# Patient Record
Sex: Female | Born: 1945 | Race: White | Hispanic: No | Marital: Married | Smoking: Never smoker
Health system: Southern US, Community
[De-identification: ages and names within clinical notes are randomized; demographics above are authoritative.]

## PROBLEM LIST (undated history)

## (undated) DIAGNOSIS — R233 Spontaneous ecchymoses: Secondary | ICD-10-CM

## (undated) DIAGNOSIS — R42 Dizziness and giddiness: Secondary | ICD-10-CM

## (undated) DIAGNOSIS — R51 Headache: Secondary | ICD-10-CM

## (undated) DIAGNOSIS — R238 Other skin changes: Secondary | ICD-10-CM

## (undated) HISTORY — PX: CHOLECYSTECTOMY: SHX55

## (undated) HISTORY — PX: OTHER SURGICAL HISTORY: SHX169

## (undated) HISTORY — PX: NECK SURGERY: SHX720

## (undated) HISTORY — PX: KNEE ARTHROSCOPY: SHX127

## (undated) HISTORY — PX: DILATION AND CURETTAGE OF UTERUS: SHX78

## (undated) HISTORY — PX: ASPIRATION BIOPSY: SHX1197

---

## 1998-10-29 ENCOUNTER — Ambulatory Visit (HOSPITAL_COMMUNITY): Admission: RE | Admit: 1998-10-29 | Discharge: 1998-10-29 | Payer: Self-pay | Admitting: Gastroenterology

## 1998-10-29 ENCOUNTER — Encounter: Payer: Self-pay | Admitting: Gastroenterology

## 1999-02-09 ENCOUNTER — Ambulatory Visit (HOSPITAL_COMMUNITY): Admission: RE | Admit: 1999-02-09 | Discharge: 1999-02-10 | Payer: Self-pay | Admitting: Surgery

## 1999-02-09 ENCOUNTER — Encounter: Payer: Self-pay | Admitting: Surgery

## 1999-05-07 ENCOUNTER — Other Ambulatory Visit: Admission: RE | Admit: 1999-05-07 | Discharge: 1999-05-07 | Payer: Self-pay | Admitting: Family Medicine

## 2004-03-06 ENCOUNTER — Emergency Department (HOSPITAL_COMMUNITY): Admission: EM | Admit: 2004-03-06 | Discharge: 2004-03-06 | Payer: Self-pay | Admitting: *Deleted

## 2013-01-05 ENCOUNTER — Other Ambulatory Visit: Payer: Self-pay | Admitting: Physical Medicine and Rehabilitation

## 2013-01-05 DIAGNOSIS — D179 Benign lipomatous neoplasm, unspecified: Secondary | ICD-10-CM

## 2013-01-10 ENCOUNTER — Ambulatory Visit
Admission: RE | Admit: 2013-01-10 | Discharge: 2013-01-10 | Disposition: A | Discharge status: Home / Self Care | Payer: Medicare Other | Source: Ambulatory Visit | Attending: Physical Medicine and Rehabilitation | Admitting: Physical Medicine and Rehabilitation

## 2013-01-10 ENCOUNTER — Other Ambulatory Visit: Payer: Self-pay | Admitting: Physical Medicine and Rehabilitation

## 2013-01-10 DIAGNOSIS — D179 Benign lipomatous neoplasm, unspecified: Secondary | ICD-10-CM

## 2013-01-10 MED ORDER — IOHEXOL 300 MG/ML  SOLN
100.0000 mL | Freq: Once | INTRAMUSCULAR | Status: AC | PRN
  Administered 2013-01-10: 100 mL via INTRAVENOUS

## 2013-02-06 ENCOUNTER — Encounter (INDEPENDENT_AMBULATORY_CARE_PROVIDER_SITE_OTHER): Payer: Self-pay | Admitting: Surgery

## 2013-02-13 ENCOUNTER — Ambulatory Visit (INDEPENDENT_AMBULATORY_CARE_PROVIDER_SITE_OTHER): Payer: BC Managed Care – PPO | Admitting: Surgery

## 2013-02-13 ENCOUNTER — Encounter (INDEPENDENT_AMBULATORY_CARE_PROVIDER_SITE_OTHER): Payer: Self-pay | Admitting: Surgery

## 2013-02-13 VITALS — BP 112/76 | HR 68 | Temp 97.4°F | Resp 16 | Ht 60.0 in | Wt 156.0 lb

## 2013-02-13 NOTE — Progress Notes (Signed)
Patient ID: Ariel Stokes, female   DOB: [birth year], 67 y.o.   MRN: [id number]  Chief Complaint  Patient presents with  . Lipoma    HPI Ariel Stokes is a 67 y.o. female.  Referred by Dr. Sheran Luz for evaluation of large retroperitoneal lipoma HPI This is a 67 year old female in good health who has chronic back pain. She recently had a MRI of her spine incidentally noted a large structure between these so as and the iliac is muscle which was consistent with a lipoma. 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 There are not any complex features to suggest liposarcoma. The patient has chronic back pain. She states that sometimes the pain is worse on her right and sometimes hurts back into her right gluteal region heading down her posterior thigh. She does have some intermittent constipation. She presents today for surgical evaluation.  History reviewed. No pertinent past medical history.  Past Surgical History  Procedure Laterality Date  . Neck surgery    . Spinal fusion neck    . Cholecystectomy    . Knee arthroscopy Left   . Dilation and curettage of uterus      History reviewed. No pertinent family history.  Social History History  Substance Use Topics  . Smoking status: Never Smoker   . Smokeless tobacco: Never Used  . Alcohol Use: No     Comment: only occasionally per week    No Known Allergies  Current Outpatient Prescriptions  Medication Sig Dispense Refill  . ibuprofen (ADVIL,MOTRIN) 200 MG tablet Take 200 mg by mouth every 6 (six) hours as needed for pain.      . Ox Bile Extract POWD by Does not apply route.       No current facility-administered medications for this visit.    Review of Systems Review of Systems  Constitutional: Negative for fever, chills and unexpected weight change.  HENT: Negative for hearing loss, congestion, sore  throat, trouble swallowing and voice change.   Eyes: Negative for visual disturbance.  Respiratory: Negative for cough and wheezing.   Cardiovascular: Negative for chest pain, palpitations and leg swelling.  Gastrointestinal: Positive for constipation and abdominal distention. Negative for nausea, vomiting, abdominal pain, diarrhea, blood in stool and anal bleeding.  Genitourinary: Negative for hematuria, vaginal bleeding and difficulty urinating.  Musculoskeletal: Positive for back pain. Negative for arthralgias.  Skin: Negative for rash and wound.  Neurological: Negative for seizures, syncope and headaches.  Hematological: Negative for adenopathy. Does not bruise/bleed easily.  Psychiatric/Behavioral: Negative for confusion.    Blood pressure 112/76, pulse 68, temperature 97.4 F (36.3 C), temperature source Temporal, resp. rate 16, height 5' (1.524 m), weight 156 lb (70.761 kg).  Physical Exam Physical Exam WDWN in NAD HEENT:  EOMI, sclera anicteric Neck:  No masses, no thyromegaly Lungs:  CTA bilaterally; normal respiratory effort CV:  Regular rate and rhythm; no murmurs Abd:  +bowel sounds, soft, non-tender, slight fullness RLQ; no palpable masses Ext:  Well-perfused; no edema Skin:  Warm, dry; no sign of jaundice  Data Reviewed Clinical Data: Occasional abdominal pain. History of  cholecystectomy. Outside lumbar MRI 12/23/2012 questioned a lipoma  between the right psoas and iliacus muscles.  CT ABDOMEN AND PELVIS WITH CONTRAST  Technique: Multidetector CT imaging of the abdomen and pelvis was  performed following the standard protocol during bolus  administration of intravenous contrast.  Contrast:  OMNIPAQUE IOHEXOL 300 MG/ML SOLN  Comparison: Report from outside MRI.  Findings: The lung bases are clear. There is no pleural effusion.  There is no significant biliary dilatation status post  cholecystectomy. The liver, spleen, pancreas and adrenal glands  appear  normal. There are left greater than right renal parapelvic  cysts. There is no hydronephrosis or suspicious renal finding.  The stomach, small bowel and colon demonstrate no abnormalities.  The uterus is atrophied. There is no adnexal mass. The bladder  appears normal.  There is asymmetric fat extending from the right pararenal space  inferiorly along the anterior aspect of the right iliacus muscle.  Within the right groin, this extends between the iliopsoas tendon  and the femoral vessels. The cephalocaudad extent is approximately  22 cm. There is no solid component associated with this. The  right colon is mildly displaced medially. There is no osseous  erosion.  Mild degenerative changes are present throughout the spine. There  is a partially calcified left paracentral disc protrusion at T12-  L1.  IMPRESSION:  1. There is a large simple lipoma within the right retroperitoneum  extending into the groin along the iliopsoas tendon. This does not  demonstrate any complex features to suggest a liposarcoma.  2. No acute abdominal pelvic findings.  3. Bilateral renal parapelvic cysts.  Original Report Authenticated By: Carey Bullocks, M.D.   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  Minimally symptomatic     Plan    Recommend core biopsy to rule out liposarcoma.  I explained to the patient and her husband that excision of lipomas needs to be a complete excision or the risk of recurrence is high. A complete excision of this very large lipoma extending from the retroperitoneum down through the femoral canal into the mid thigh would be rather extensive and may even require 2 separate incisions. She is minimally symptomatic from the mass at this time. We will obtain a core biopsy and then we'll see the patient back to discuss the findings and to determine whether to proceed with surgery.  Wilmon Arms. [name], MD, [facility]  02/13/2013 3:23  PM         Ariel Stokes K. 02/13/2013, 3:14 PM

## 2013-02-14 ENCOUNTER — Encounter (HOSPITAL_COMMUNITY): Payer: Self-pay

## 2013-02-14 ENCOUNTER — Other Ambulatory Visit: Payer: Self-pay | Admitting: Radiology

## 2013-02-15 ENCOUNTER — Encounter (HOSPITAL_COMMUNITY): Payer: Self-pay

## 2013-02-15 ENCOUNTER — Ambulatory Visit (HOSPITAL_COMMUNITY)
Admission: RE | Admit: 2013-02-15 | Discharge: 2013-02-15 | Disposition: A | Discharge status: Home / Self Care | Payer: BC Managed Care – PPO | Source: Ambulatory Visit | Attending: Surgery | Admitting: Surgery

## 2013-02-15 DIAGNOSIS — D175 Benign lipomatous neoplasm of intra-abdominal organs: Secondary | ICD-10-CM | POA: Insufficient documentation

## 2013-02-15 LAB — CBC
MCHC: 33.9 g/dL (ref 30.0–36.0)
Platelets: 143 10*3/uL — ABNORMAL LOW (ref 150–400)
RDW: 13.5 % (ref 11.5–15.5)
WBC: 4.4 10*3/uL (ref 4.0–10.5)

## 2013-02-15 LAB — PROTIME-INR
INR: 1.02 (ref 0.00–1.49)
Prothrombin Time: 13.3 seconds (ref 11.6–15.2)

## 2013-02-15 LAB — APTT: aPTT: 31 seconds (ref 24–37)

## 2013-02-15 MED ORDER — MIDAZOLAM HCL 2 MG/2ML IJ SOLN
INTRAMUSCULAR | Status: AC | PRN
  Administered 2013-02-15: 1 mg via INTRAVENOUS

## 2013-02-15 MED ORDER — FENTANYL CITRATE 0.05 MG/ML IJ SOLN
INTRAMUSCULAR | Status: AC | PRN
  Administered 2013-02-15: 50 ug via INTRAVENOUS

## 2013-02-15 MED ORDER — FENTANYL CITRATE 0.05 MG/ML IJ SOLN
INTRAMUSCULAR | Status: AC
  Filled 2013-02-15: qty 4

## 2013-02-15 MED ORDER — MIDAZOLAM HCL 2 MG/2ML IJ SOLN
INTRAMUSCULAR | Status: AC
  Filled 2013-02-15: qty 4

## 2013-02-15 MED ORDER — SODIUM CHLORIDE 0.9 % IV SOLN
Freq: Once | INTRAVENOUS | Status: AC
  Administered 2013-02-15: 1000 mL via INTRAVENOUS

## 2013-02-15 NOTE — H&P (Signed)
Agree 

## 2013-02-15 NOTE — Procedures (Signed)
Procedure:  CT guided core biopsy of retroperitoneal mass Findings:  Right pelvic RP fatty tumor sampled with 18 G cores x 7 via 17 G needle. No complications.

## 2013-02-15 NOTE — H&P (Signed)
Ariel Stokes is an 67 y.o. female.   Chief Complaint: History of back pain; worse x 2 months MRI and CT reveal large Rt retroperitoneal mass Scheduled now for biopsy HPI: back pain  History reviewed. No pertinent past medical history.  Past Surgical History  Procedure Laterality Date  . Neck surgery    . Spinal fusion neck    . Cholecystectomy    . Knee arthroscopy Left   . Dilation and curettage of uterus      History reviewed. No pertinent family history. Social History:  reports that she has never smoked. She has never used smokeless tobacco. She reports that she does not drink alcohol or use illicit drugs.  Allergies:  Allergies  Allergen Reactions  . Other     Band-Aids- cause rash     (Not in a hospital admission)  No results found for this or any previous visit (from the past 48 hour(s)). No results found.  Review of Systems  Constitutional: Negative for fever.  Respiratory: Negative for cough.   Cardiovascular: Negative for chest pain.  Gastrointestinal: Negative for nausea and vomiting.  Musculoskeletal: Positive for back pain.  Neurological: Negative for weakness and headaches.    Blood pressure 149/87, pulse 70, temperature 96.9 F (36.1 C), temperature source Oral, resp. rate 18, height 5' 0.5" (1.537 m), weight 154 lb (69.854 kg), SpO2 97.00%. Physical Exam  Constitutional: She is oriented to person, place, and time. She appears well-developed and well-nourished.  Cardiovascular: Normal rate, regular rhythm and normal heart sounds.   No murmur heard. Respiratory: Effort normal and breath sounds normal. She has no wheezes.  GI: Soft. Bowel sounds are normal. There is tenderness.  Musculoskeletal: Normal range of motion.  Neurological: She is alert and oriented to person, place, and time.  Skin: Skin is warm and dry.  Psychiatric: She has a normal mood and affect. Her behavior is normal. Judgment and thought content normal.      Assessment/Plan Back pain x 2 months MRI and CT show retroperitoneal mass Scheduled now for biopsy of same Pt aware of procedure benefits and risks and agreeable to proceed Consent signed and in chart  TURPIN,PAMELA A 02/15/2013, 8:00 AM

## 2013-02-26 ENCOUNTER — Ambulatory Visit (INDEPENDENT_AMBULATORY_CARE_PROVIDER_SITE_OTHER): Payer: BC Managed Care – PPO | Admitting: Surgery

## 2013-02-26 ENCOUNTER — Encounter (INDEPENDENT_AMBULATORY_CARE_PROVIDER_SITE_OTHER): Payer: Self-pay | Admitting: Surgery

## 2013-02-26 VITALS — BP 122/64 | HR 68 | Temp 97.2°F | Resp 16 | Ht 60.5 in | Wt 156.0 lb

## 2013-02-26 DIAGNOSIS — D175 Benign lipomatous neoplasm of intra-abdominal organs: Secondary | ICD-10-CM

## 2013-02-26 NOTE — Progress Notes (Addendum)
Chief Complaint   Patient presents with   .  Lipoma   HPI  Ariel Stokes is a 67 y.o. female. Referred by Dr. 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 She recently had a MRI of her spine incidentally noted a large structure between these so as and the iliac is muscle which was consistent with a lipoma. 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 There are not any complex features to suggest liposarcoma. The patient has chronic back pain. She states that sometimes the pain is worse on her right and sometimes hurts back into her right gluteal region heading down her posterior thigh. She does have some intermittent constipation.  CT-guided biopsy of the mass shows mature adipose tissue with no malignancy or atypia.  History reviewed. No pertinent past medical history.  Past Surgical History   Procedure  Laterality  Date   .  Neck surgery     .  Spinal fusion neck     .  Cholecystectomy     .  Knee arthroscopy  Left    .  Dilation and curettage of uterus     History reviewed. No pertinent family history.  Social History  History   Substance Use Topics   .  Smoking status:  Never Smoker   .  Smokeless tobacco:  Never Used   .  Alcohol Use:  No      Comment: only occasionally per week   No Known Allergies  Current Outpatient Prescriptions   Medication  Sig  Dispense  Refill   .  ibuprofen (ADVIL,MOTRIN) 200 MG tablet  Take 200 mg by mouth every 6 (six) hours as needed for pain.     .  Ox Bile Extract POWD  by Does not apply route.      No current facility-administered medications for this visit.   Review of Systems  Review of Systems  Constitutional: Negative for fever, chills and unexpected weight change.  HENT: Negative for hearing loss, congestion, sore throat, trouble  swallowing and voice change.  Eyes: Negative for visual disturbance.  Respiratory: Negative for cough and wheezing.  Cardiovascular: Negative for chest pain, palpitations and leg swelling.  Gastrointestinal: Positive for constipation and abdominal distention. Negative for nausea, vomiting, abdominal pain, diarrhea, blood in stool and anal bleeding.  Genitourinary: Negative for hematuria, vaginal bleeding and difficulty urinating.  Musculoskeletal: Positive for back pain. Negative for arthralgias.  Skin: Negative for rash and wound.  Neurological: Negative for seizures, syncope and headaches.  Hematological: Negative for adenopathy. Does not bruise/bleed easily.  Psychiatric/Behavioral: Negative for confusion.  Blood pressure 112/76, pulse 68, temperature 97.4 F (36.3 C), temperature source Temporal, resp. rate 16, height 5' (1.524 m), weight 156 lb (70.761 kg).  Physical Exam  Physical Exam  WDWN in NAD  HEENT: EOMI, sclera anicteric  Neck: No masses, no thyromegaly  Lungs: CTA bilaterally; normal respiratory effort  CV: Regular rate and rhythm; no murmurs  Abd: +bowel sounds, soft, non-tender, slight fullness RLQ; no palpable masses  Ext: Well-perfused; no edema  Skin: Warm, dry; no sign of jaundice  Data Reviewed  Clinical Data: Occasional abdominal pain. History of  cholecystectomy. Outside lumbar MRI 12/23/2012 questioned a lipoma  between the right psoas and iliacus muscles.  CT ABDOMEN AND PELVIS WITH CONTRAST  Technique:  Multidetector CT imaging of the abdomen and pelvis was  performed following the standard protocol during bolus  administration of intravenous contrast.  Contrast: OMNIPAQUE IOHEXOL 300 MG/ML SOLN  Comparison: Report from outside MRI.  Findings: The lung bases are clear. There is no pleural effusion.  There is no significant biliary dilatation status post  cholecystectomy. The liver, spleen, pancreas and adrenal glands  appear normal. There are left  greater than right renal parapelvic  cysts. There is no hydronephrosis or suspicious renal finding.  The stomach, small bowel and colon demonstrate no abnormalities.  The uterus is atrophied. There is no adnexal mass. The bladder  appears normal.  There is asymmetric fat extending from the right pararenal space  inferiorly along the anterior aspect of the right iliacus muscle.  Within the right groin, this extends between the iliopsoas tendon  and the femoral vessels. The cephalocaudad extent is approximately  22 cm. There is no solid component associated with this. The  right colon is mildly displaced medially. There is no osseous  erosion.  Mild degenerative changes are present throughout the spine. There  is a partially calcified left paracentral disc protrusion at T12-  L1.  IMPRESSION:  1. There is a large simple lipoma within the right retroperitoneum  extending into the groin along the iliopsoas tendon. This does not  demonstrate any complex features to suggest a liposarcoma.  2. No acute abdominal pelvic findings.  3. Bilateral renal parapelvic cysts.  Original Report Authenticated By: Carey Bullocks, M.D.  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  Patient has some symptoms of constipation, which may be attributed to the displacement of her right colon by the mass.  She also has chronic right buttock and upper leg pain which could be related to the segment of the lipoma running down to the femoral canal. Plan  Due to the patient's symptoms that may be a result of the very large mass, we will consider excision of the large retroperitoneal lipoma. I would like to discuss this case with a vascular surgeon as I may need their assistance with the femoral canal dissection. We will also explore the possibility of mobilizing this mass using laparoscopic techniques. After have discussed this with the other surgeons, I will contact the patient to schedule surgery.   The surgical procedure has been discussed with the patient.  Potential risks, benefits, alternative treatments, and expected outcomes have been explained.  All of the patient's questions at this time have been answered.  The likelihood of reaching the patient's treatment goal is good.  The patient understand the proposed surgical procedure and wishes to proceed.   Wilmon Arms. [name], MD, [facility]  02/26/2013 4:32 PM    She would like to wait until early June to plan her surgery.  Wilmon Arms. [name], MD, [facility]  02/26/2013 4:40 PM

## 2013-02-28 ENCOUNTER — Other Ambulatory Visit (INDEPENDENT_AMBULATORY_CARE_PROVIDER_SITE_OTHER): Payer: Self-pay | Admitting: Surgery

## 2013-02-28 NOTE — Progress Notes (Signed)
I have reviewed the patient's case with vascular surgery. They have agreed to be available if needed. I called the patient to discuss the upcoming surgery. We will plan a laparoscopic approach to try mobilize as much of the retroperitoneal lipomas possible. If this extends down into the femoral canal and does not mobilized easily then the vascular surgeons will come assist in opening the femoral canal and help to mobilize the mass.  The surgical procedure has been discussed with the patient.  Potential risks, benefits, alternative treatments, and expected outcomes have been explained.  All of the patient's questions at this time have been answered.  The likelihood of reaching the patient's treatment goal is good.  The patient understand the proposed surgical procedure and wishes to proceed. We will schedule this in June, per the patient's request  Ariel Stokes. [name], MD, [facility]  02/28/2013 4:46 PM

## 2013-03-05 ENCOUNTER — Encounter (HOSPITAL_COMMUNITY): Payer: Self-pay | Admitting: *Deleted

## 2013-03-05 ENCOUNTER — Telehealth (INDEPENDENT_AMBULATORY_CARE_PROVIDER_SITE_OTHER): Payer: Self-pay | Admitting: *Deleted

## 2013-03-05 ENCOUNTER — Emergency Department (HOSPITAL_COMMUNITY)
Admission: EM | Admit: 2013-03-05 | Discharge: 2013-03-05 | Disposition: A | Discharge status: Home / Self Care | Payer: BC Managed Care – PPO | Attending: Emergency Medicine | Admitting: Emergency Medicine

## 2013-03-05 DIAGNOSIS — M79604 Pain in right leg: Secondary | ICD-10-CM

## 2013-03-05 DIAGNOSIS — Z8589 Personal history of malignant neoplasm of other organs and systems: Secondary | ICD-10-CM | POA: Insufficient documentation

## 2013-03-05 DIAGNOSIS — M79609 Pain in unspecified limb: Secondary | ICD-10-CM | POA: Insufficient documentation

## 2013-03-05 DIAGNOSIS — M25579 Pain in unspecified ankle and joints of unspecified foot: Secondary | ICD-10-CM | POA: Insufficient documentation

## 2013-03-05 DIAGNOSIS — Z9889 Other specified postprocedural states: Secondary | ICD-10-CM | POA: Insufficient documentation

## 2013-03-05 DIAGNOSIS — M79671 Pain in right foot: Secondary | ICD-10-CM

## 2013-03-05 DIAGNOSIS — Z79899 Other long term (current) drug therapy: Secondary | ICD-10-CM | POA: Insufficient documentation

## 2013-03-05 MED ORDER — OXYCODONE-ACETAMINOPHEN 5-325 MG PO TABS: 1.0000 | ORAL_TABLET | ORAL | Status: DC | PRN

## 2013-03-05 MED ORDER — IBUPROFEN 200 MG PO TABS
600.0000 mg | ORAL_TABLET | Freq: Once | ORAL | Status: AC
  Administered 2013-03-05: 600 mg via ORAL
  Filled 2013-03-05: qty 3

## 2013-03-05 MED ORDER — OXYCODONE-ACETAMINOPHEN 5-325 MG PO TABS
1.0000 | ORAL_TABLET | Freq: Once | ORAL | Status: AC
  Administered 2013-03-05: 1 via ORAL
  Filled 2013-03-05: qty 1

## 2013-03-05 NOTE — Telephone Encounter (Signed)
Husband came to office to report patient has new symptoms.  Husband reports beginning yesterday patient has been unable to walk and pain is becoming severe.  Husband concerned that patient is now having to use crutches due to inability to use right leg.  Husband encouraged to take patient to ED at [facility] due to new symptoms and severe pain.

## 2013-03-05 NOTE — ED Provider Notes (Signed)
History    67 year old female who with right foot right lower extremity pain. Gradual onset on Wednesday and progressively worsening. Atraumatic. Kidney and in the bottom of her foot. Has since progressed to the instep and up the back of her calf. Pain is worse with movement. No fevers or chills. No rash. No swelling. No hx of blood clot. Hx of a large abdominal lipoma and concerned symptoms may be related to this.   CSN: [id number]  Arrival date & time 03/05/13  1130   First MD Initiated Contact with Patient 03/05/13 1218      Chief Complaint  Patient presents with  . Foot Pain  . Leg Pain    (Consider location/radiation/quality/duration/timing/severity/associated sxs/prior treatment) HPI  History reviewed. No pertinent past medical history.  Past Surgical History  Procedure Laterality Date  . Neck surgery    . Spinal fusion neck    . Cholecystectomy    . Knee arthroscopy Left   . Dilation and curettage of uterus      History reviewed. No pertinent family history.  History  Substance Use Topics  . Smoking status: Never Smoker   . Smokeless tobacco: Never Used  . Alcohol Use: No     Comment: only occasionally per week    OB History   Grav Para Term Preterm Abortions TAB SAB Ect Mult Living                  Review of Systems  All systems reviewed and negative, other than as noted in HPI.   Allergies  Other  Home Medications   Current Outpatient Rx  Name  Route  Sig  Dispense  Refill  . ibuprofen (ADVIL,MOTRIN) 200 MG tablet   Oral   Take 200 mg by mouth every 6 (six) hours as needed for pain.         . Magnesium 250 MG TABS   Oral   Take 250 mg by mouth daily.         Marland Kitchen OVER THE COUNTER MEDICATION   Oral   Take 1 capsule by mouth 3 (three) times daily with meals. Ox Bile Extract Gel Capsules 500mg          . traMADol (ULTRAM) 50 MG tablet   Oral   Take 50 mg by mouth every 6 (six) hours as needed for pain (ankle pain).           BP  151/82  Pulse 68  Temp(Src) 97.9 F (36.6 C) (Oral)  Resp 16  SpO2 100%  Physical Exam  Nursing note and vitals reviewed. Constitutional: She appears well-developed and well-nourished. No distress.  HENT:  Head: Normocephalic and atraumatic.  Eyes: Conjunctivae are normal. Right eye exhibits no discharge. Left eye exhibits no discharge.  Neck: Neck supple.  Cardiovascular: Normal rate, regular rhythm and normal heart sounds.  Exam reveals no gallop and no friction rub.   No murmur heard. Pulmonary/Chest: Effort normal and breath sounds normal. No respiratory distress.  Abdominal: Soft. She exhibits no distension. There is no tenderness.  Musculoskeletal: She exhibits no edema and no tenderness.  RLE symmetric as compared to left. Appears well perfused. No edema appreciated. No concerning skin lesions. Easily palpable DP pulses. Good strength and symmetric to L side with hip/knee flex/ext and foot plantar/dorsiflexion.   Neurological: She is alert.  Skin: Skin is warm and dry.  Psychiatric: She has a normal mood and affect. Her behavior is normal. Thought content normal.    ED Course  Procedures (including critical care time)  Labs Reviewed - No data to display No results found.   1. Foot pain, right   2. Leg pain, right       MDM  66yF with R foot/leg pain. Large abdominal lipoma extending through R groin. Symptoms could be compressive in nature from although wouldn't necessarily expect symptoms along bottom of foot and back of leg with femoral/saphenous nerve compression.  If truly radicular, more likely from compression in sacral region. Doubt infectious. Doubt DVT or other vascular etiology.        Raeford Razor, MD 03/06/13 1054

## 2013-03-05 NOTE — ED Notes (Addendum)
Pt c/o right foot pain and leg pain worsening since Wednesday. Reports is being treated for lipoma in abdomen and Dr. [name] is seeing pt for lipoma, pt would like for Dr. [name] to be notified if lipoma is pressing on sciatic and is the cause for foot pain/leg pain.

## 2013-03-27 ENCOUNTER — Encounter (INDEPENDENT_AMBULATORY_CARE_PROVIDER_SITE_OTHER): Payer: Self-pay

## 2013-05-10 ENCOUNTER — Encounter (HOSPITAL_COMMUNITY): Payer: Self-pay | Admitting: Pharmacy Technician

## 2013-05-11 NOTE — Pre-Procedure Instructions (Signed)
Ariel Stokes  05/11/2013   Your procedure is scheduled on:  Tuesday, June 10th  Report to [facility] Stay Center at 0530 AM, come to main entrance "A" and go up to [employer] and go to 3rd floor, check in at short stay desk.  Call this number if you have problems the morning of surgery: [phone]   Remember:   Do not eat food or drink liquids after midnight.   Take these medicines the morning of surgery with A SIP OF WATER: none   Do not wear jewelry, make-up or nail polish.  Do not wear lotions, powders, or perfumes. You may wear not deodorant.  Do not shave 48 hours prior to surgery. Men may shave face and neck.  Do not bring valuables to the hospital.  [facility] is not responsible for any belongings or valuables.  Contacts, dentures or bridgework may not be worn into surgery.  Leave suitcase in the car. After surgery it may be brought to your room.  For patients admitted to the hospital, checkout time is 11:00 AM the day of discharge.   Patients discharged the day of surgery will not be allowed to drive home.    Special Instructions: Shower using CHG 2 nights before surgery and the night before surgery.  If you shower the day of surgery use CHG.  Use special wash - you have one bottle of CHG for all showers.  You should use approximately 1/3 of the bottle for each shower.   Please read over the following fact sheets that you were given: Pain Booklet, Coughing and Deep Breathing, MRSA Information and Surgical Site Infection Prevention

## 2013-05-14 ENCOUNTER — Encounter (HOSPITAL_COMMUNITY): Payer: Self-pay

## 2013-05-14 ENCOUNTER — Encounter (HOSPITAL_COMMUNITY)
Admission: RE | Admit: 2013-05-14 | Discharge: 2013-05-14 | Disposition: A | Discharge status: Home / Self Care | Payer: BC Managed Care – PPO | Source: Ambulatory Visit | Attending: Surgery | Admitting: Surgery

## 2013-05-14 DIAGNOSIS — Z01812 Encounter for preprocedural laboratory examination: Secondary | ICD-10-CM | POA: Insufficient documentation

## 2013-05-14 HISTORY — DX: Headache: R51

## 2013-05-14 HISTORY — DX: Dizziness and giddiness: R42

## 2013-05-14 LAB — CBC
Hemoglobin: 13.9 g/dL (ref 12.0–15.0)
Platelets: 142 10*3/uL — ABNORMAL LOW (ref 150–400)
RBC: 4.62 MIL/uL (ref 3.87–5.11)
WBC: 4.3 10*3/uL (ref 4.0–10.5)

## 2013-05-14 LAB — SURGICAL PCR SCREEN
MRSA, PCR: NEGATIVE
Staphylococcus aureus: NEGATIVE

## 2013-05-14 NOTE — Progress Notes (Signed)
Primary Physician - Dr. Catha Gosselin Does not have a cardiologist No prior cardiac workup

## 2013-05-21 MED ORDER — CEFAZOLIN SODIUM-DEXTROSE 2-3 GM-% IV SOLR
2.0000 g | INTRAVENOUS | Status: AC
  Administered 2013-05-22: 2 g via INTRAVENOUS
  Filled 2013-05-21: qty 50

## 2013-05-21 NOTE — H&P (Signed)
HPI  Ariel Stokes is a 67 y.o. female. Referred by Dr. 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 She recently had a MRI of her spine incidentally noted a large structure between these so as and the iliac is muscle which was consistent with a lipoma. 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 There are not any complex features to suggest liposarcoma. The patient has chronic back pain. She states that sometimes the pain is worse on her right and sometimes hurts back into her right gluteal region heading down her posterior thigh. She does have some intermittent constipation. CT-guided biopsy of the mass shows mature adipose tissue with no malignancy or atypia.  History reviewed. No pertinent past medical history.  Past Surgical History   Procedure  Laterality  Date   .  Neck surgery     .  Spinal fusion neck     .  Cholecystectomy     .  Knee arthroscopy  Left    .  Dilation and curettage of uterus     History reviewed. No pertinent family history.  Social History  History   Substance Use Topics   .  Smoking status:  Never Smoker   .  Smokeless tobacco:  Never Used   .  Alcohol Use:  No      Comment: only occasionally per week   No Known Allergies  Current Outpatient Prescriptions   Medication  Sig  Dispense  Refill   .  ibuprofen (ADVIL,MOTRIN) 200 MG tablet  Take 200 mg by mouth every 6 (six) hours as needed for pain.     .  Ox Bile Extract POWD  by Does not apply route.      No current facility-administered medications for this visit.   Review of Systems  Review of Systems  Constitutional: Negative for fever, chills and unexpected weight change.  HENT: Negative for hearing loss, congestion, sore throat, trouble swallowing and voice change.  Eyes: Negative for visual  disturbance.  Respiratory: Negative for cough and wheezing.  Cardiovascular: Negative for chest pain, palpitations and leg swelling.  Gastrointestinal: Positive for constipation and abdominal distention. Negative for nausea, vomiting, abdominal pain, diarrhea, blood in stool and anal bleeding.  Genitourinary: Negative for hematuria, vaginal bleeding and difficulty urinating.  Musculoskeletal: Positive for back pain. Negative for arthralgias.  Skin: Negative for rash and wound.  Neurological: Negative for seizures, syncope and headaches.  Hematological: Negative for adenopathy. Does not bruise/bleed easily.  Psychiatric/Behavioral: Negative for confusion.  Blood pressure 112/76, pulse 68, temperature 97.4 F (36.3 C), temperature source Temporal, resp. rate 16, height 5' (1.524 m), weight 156 lb (70.761 kg).  Physical Exam  Physical Exam  WDWN in NAD  HEENT: EOMI, sclera anicteric  Neck: No masses, no thyromegaly  Lungs: CTA bilaterally; normal respiratory effort  CV: Regular rate and rhythm; no murmurs  Abd: +bowel sounds, soft, non-tender, slight fullness RLQ; no palpable masses  Ext: Well-perfused; no edema  Skin: Warm, dry; no sign of jaundice  Data Reviewed  Clinical Data: Occasional abdominal pain. History of  cholecystectomy. Outside lumbar MRI 12/23/2012 questioned a lipoma  between the right psoas and iliacus muscles.  CT ABDOMEN AND PELVIS WITH CONTRAST  Technique: Multidetector CT imaging of the abdomen and pelvis was  performed following the standard protocol  during bolus  administration of intravenous contrast.  Contrast: OMNIPAQUE IOHEXOL 300 MG/ML SOLN  Comparison: Report from outside MRI.  Findings: The lung bases are clear. There is no pleural effusion.  There is no significant biliary dilatation status post  cholecystectomy. The liver, spleen, pancreas and adrenal glands  appear normal. There are left greater than right renal parapelvic  cysts. There is no  hydronephrosis or suspicious renal finding.  The stomach, small bowel and colon demonstrate no abnormalities.  The uterus is atrophied. There is no adnexal mass. The bladder  appears normal.  There is asymmetric fat extending from the right pararenal space  inferiorly along the anterior aspect of the right iliacus muscle.  Within the right groin, this extends between the iliopsoas tendon  and the femoral vessels. The cephalocaudad extent is approximately  22 cm. There is no solid component associated with this. The  right colon is mildly displaced medially. There is no osseous  erosion.  Mild degenerative changes are present throughout the spine. There  is a partially calcified left paracentral disc protrusion at T12-  L1.  IMPRESSION:  1. There is a large simple lipoma within the right retroperitoneum  extending into the groin along the iliopsoas tendon. This does not  demonstrate any complex features to suggest a liposarcoma.  2. No acute abdominal pelvic findings.  3. Bilateral renal parapelvic cysts.  Original Report Authenticated By: Carey Bullocks, M.D.  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 Patient has some symptoms of constipation, which may be attributed to the displacement of her right colon by the mass. She also has chronic right buttock and upper leg pain which could be related to the segment of the lipoma running down to the femoral canal.  Plan  Due to the patient's symptoms that may be a result of the very large mass, we will consider excision of the large retroperitoneal lipoma. We will also explore the possibility of mobilizing this mass using laparoscopic techniques. The surgical procedure has been discussed with the patient. Potential risks, benefits, alternative treatments, and expected outcomes have been explained. All of the patient's questions at this time have been answered. The likelihood of reaching the patient's treatment goal is  good. The patient understand the proposed surgical procedure and wishes to proceed.  Wilmon Arms. [name], MD, [facility]  General/ Trauma Surgery  05/21/2013 8:20 PM

## 2013-05-22 ENCOUNTER — Ambulatory Visit (HOSPITAL_COMMUNITY): Payer: BC Managed Care – PPO | Admitting: Anesthesiology

## 2013-05-22 ENCOUNTER — Inpatient Hospital Stay (HOSPITAL_COMMUNITY)
Admission: RE | Admit: 2013-05-22 | Discharge: 2013-05-25 | DRG: 171 | Disposition: A | Discharge status: Home / Self Care | Payer: BC Managed Care – PPO | Source: Ambulatory Visit | Attending: Surgery | Admitting: Surgery

## 2013-05-22 ENCOUNTER — Encounter (HOSPITAL_COMMUNITY): Payer: Self-pay

## 2013-05-22 ENCOUNTER — Encounter (HOSPITAL_COMMUNITY): Payer: Self-pay | Admitting: Anesthesiology

## 2013-05-22 ENCOUNTER — Encounter (HOSPITAL_COMMUNITY)
Admission: RE | Disposition: A | Discharge status: Home / Self Care | Payer: Self-pay | Source: Ambulatory Visit | Attending: Surgery

## 2013-05-22 DIAGNOSIS — Z5331 Laparoscopic surgical procedure converted to open procedure: Secondary | ICD-10-CM

## 2013-05-22 DIAGNOSIS — M549 Dorsalgia, unspecified: Secondary | ICD-10-CM | POA: Diagnosis present

## 2013-05-22 DIAGNOSIS — G8929 Other chronic pain: Secondary | ICD-10-CM | POA: Diagnosis present

## 2013-05-22 DIAGNOSIS — D175 Benign lipomatous neoplasm of intra-abdominal organs: Principal | ICD-10-CM | POA: Diagnosis present

## 2013-05-22 HISTORY — PX: LIPOMA EXCISION: SHX5283

## 2013-05-22 HISTORY — DX: Spontaneous ecchymoses: R23.3

## 2013-05-22 HISTORY — PX: COLON RESECTION: SHX5231

## 2013-05-22 HISTORY — DX: Other skin changes: R23.8

## 2013-05-22 SURGERY — COLON RESECTION LAPAROSCOPIC
Anesthesia: General | Site: Abdomen | Wound class: Clean

## 2013-05-22 MED ORDER — LACTATED RINGERS IV SOLN
INTRAVENOUS | Status: DC | PRN
  Administered 2013-05-22 (×2): via INTRAVENOUS

## 2013-05-22 MED ORDER — CEFAZOLIN SODIUM 1-5 GM-% IV SOLN
1.0000 g | Freq: Four times a day (QID) | INTRAVENOUS | Status: AC
  Administered 2013-05-22 (×3): 1 g via INTRAVENOUS
  Filled 2013-05-22 (×3): qty 50

## 2013-05-22 MED ORDER — BUPIVACAINE-EPINEPHRINE PF 0.25-1:200000 % IJ SOLN
INTRAMUSCULAR | Status: AC
  Filled 2013-05-22: qty 30

## 2013-05-22 MED ORDER — NALOXONE HCL 0.4 MG/ML IJ SOLN: 0.4000 mg | INTRAMUSCULAR | Status: DC | PRN

## 2013-05-22 MED ORDER — DIPHENHYDRAMINE HCL 50 MG/ML IJ SOLN: 12.5000 mg | Freq: Four times a day (QID) | INTRAMUSCULAR | Status: DC | PRN

## 2013-05-22 MED ORDER — FENTANYL CITRATE 0.05 MG/ML IJ SOLN
INTRAMUSCULAR | Status: DC | PRN
  Administered 2013-05-22: 100 ug via INTRAVENOUS
  Administered 2013-05-22: 50 ug via INTRAVENOUS

## 2013-05-22 MED ORDER — ONDANSETRON HCL 4 MG/2ML IJ SOLN: 4.0000 mg | Freq: Once | INTRAMUSCULAR | Status: DC | PRN

## 2013-05-22 MED ORDER — HYDROMORPHONE HCL PF 1 MG/ML IJ SOLN
INTRAMUSCULAR | Status: AC
  Filled 2013-05-22: qty 1

## 2013-05-22 MED ORDER — PROMETHAZINE HCL 25 MG/ML IJ SOLN
INTRAMUSCULAR | Status: AC
  Administered 2013-05-22: 12.5 mg via INTRAVENOUS
  Filled 2013-05-22: qty 1

## 2013-05-22 MED ORDER — MORPHINE SULFATE (PF) 1 MG/ML IV SOLN
INTRAVENOUS | Status: AC
  Filled 2013-05-22: qty 25

## 2013-05-22 MED ORDER — ONDANSETRON HCL 4 MG/2ML IJ SOLN
4.0000 mg | Freq: Four times a day (QID) | INTRAMUSCULAR | Status: DC | PRN
  Filled 2013-05-22: qty 2

## 2013-05-22 MED ORDER — ROCURONIUM BROMIDE 100 MG/10ML IV SOLN
INTRAVENOUS | Status: DC | PRN
  Administered 2013-05-22: 35 mg via INTRAVENOUS

## 2013-05-22 MED ORDER — ONDANSETRON HCL 4 MG PO TABS: 4.0000 mg | ORAL_TABLET | Freq: Four times a day (QID) | ORAL | Status: DC | PRN

## 2013-05-22 MED ORDER — ONDANSETRON HCL 4 MG/2ML IJ SOLN
4.0000 mg | Freq: Four times a day (QID) | INTRAMUSCULAR | Status: DC | PRN
  Administered 2013-05-22 – 2013-05-23 (×4): 4 mg via INTRAVENOUS
  Filled 2013-05-22 (×3): qty 2

## 2013-05-22 MED ORDER — NEOSTIGMINE METHYLSULFATE 1 MG/ML IJ SOLN
INTRAMUSCULAR | Status: DC | PRN
  Administered 2013-05-22: 3 mg via INTRAVENOUS

## 2013-05-22 MED ORDER — PROMETHAZINE HCL 25 MG/ML IJ SOLN: 12.5000 mg | INTRAMUSCULAR | Status: DC | PRN

## 2013-05-22 MED ORDER — HYDROMORPHONE HCL PF 1 MG/ML IJ SOLN
0.2500 mg | INTRAMUSCULAR | Status: DC | PRN
  Administered 2013-05-22 (×4): 0.5 mg via INTRAVENOUS

## 2013-05-22 MED ORDER — ENOXAPARIN SODIUM 40 MG/0.4ML ~~LOC~~ SOLN
40.0000 mg | SUBCUTANEOUS | Status: DC
  Administered 2013-05-23 – 2013-05-25 (×3): 40 mg via SUBCUTANEOUS
  Filled 2013-05-22 (×4): qty 0.4

## 2013-05-22 MED ORDER — ARTIFICIAL TEARS OP OINT
TOPICAL_OINTMENT | OPHTHALMIC | Status: DC | PRN
  Administered 2013-05-22: 1 via OPHTHALMIC

## 2013-05-22 MED ORDER — SODIUM CHLORIDE 0.9 % IV SOLN
INTRAVENOUS | Status: DC
  Administered 2013-05-22 – 2013-05-24 (×3): via INTRAVENOUS

## 2013-05-22 MED ORDER — ONDANSETRON HCL 4 MG/2ML IJ SOLN
INTRAMUSCULAR | Status: DC | PRN
  Administered 2013-05-22: 4 mg via INTRAVENOUS

## 2013-05-22 MED ORDER — LIDOCAINE HCL (CARDIAC) 20 MG/ML IV SOLN
INTRAVENOUS | Status: DC | PRN
  Administered 2013-05-22: 50 mg via INTRAVENOUS

## 2013-05-22 MED ORDER — GLYCOPYRROLATE 0.2 MG/ML IJ SOLN
INTRAMUSCULAR | Status: DC | PRN
  Administered 2013-05-22: 0.6 mg via INTRAVENOUS
  Administered 2013-05-22: .2 mg via INTRAVENOUS

## 2013-05-22 MED ORDER — PROPOFOL 10 MG/ML IV BOLUS
INTRAVENOUS | Status: DC | PRN
  Administered 2013-05-22: 200 mg via INTRAVENOUS

## 2013-05-22 MED ORDER — SODIUM CHLORIDE 0.9 % IJ SOLN: 9.0000 mL | INTRAMUSCULAR | Status: DC | PRN

## 2013-05-22 MED ORDER — DIPHENHYDRAMINE HCL 12.5 MG/5ML PO ELIX: 12.5000 mg | ORAL_SOLUTION | Freq: Four times a day (QID) | ORAL | Status: DC | PRN

## 2013-05-22 MED ORDER — 0.9 % SODIUM CHLORIDE (POUR BTL) OPTIME
TOPICAL | Status: DC | PRN
  Administered 2013-05-22 (×4): 1000 mL

## 2013-05-22 MED ORDER — PHENYLEPHRINE HCL 10 MG/ML IJ SOLN
INTRAMUSCULAR | Status: DC | PRN
  Administered 2013-05-22: 80 ug via INTRAVENOUS

## 2013-05-22 MED ORDER — MORPHINE SULFATE (PF) 1 MG/ML IV SOLN
INTRAVENOUS | Status: DC
  Administered 2013-05-22: 10:00:00 via INTRAVENOUS
  Administered 2013-05-23: 1.5 mg via INTRAVENOUS
  Administered 2013-05-23 (×2): 3 mg via INTRAVENOUS
  Administered 2013-05-23: 1.5 mg via INTRAVENOUS
  Administered 2013-05-23: 3 mg via INTRAVENOUS

## 2013-05-22 MED ORDER — BUPIVACAINE-EPINEPHRINE 0.25% -1:200000 IJ SOLN
INTRAMUSCULAR | Status: DC | PRN
  Administered 2013-05-22: 6 mL

## 2013-05-22 SURGICAL SUPPLY — 68 items
APPLIER CLIP ROT 10 11.4 M/L (STAPLE)
BLADE SURG 10 STRL SS (BLADE) ×2 IMPLANT
BLADE SURG ROTATE 9660 (MISCELLANEOUS) IMPLANT
CANISTER SUCTION 2500CC (MISCELLANEOUS) ×4 IMPLANT
CELLS DAT CNTRL 66122 CELL SVR (MISCELLANEOUS) IMPLANT
CHLORAPREP W/TINT 26ML (MISCELLANEOUS) ×2 IMPLANT
CLAMP ENDO BABCK 10MM (STAPLE) IMPLANT
CLIP APPLIE ROT 10 11.4 M/L (STAPLE) IMPLANT
CLOTH BEACON ORANGE TIMEOUT ST (SAFETY) ×2 IMPLANT
COVER SURGICAL LIGHT HANDLE (MISCELLANEOUS) ×2 IMPLANT
DISSECTOR BLUNT TIP ENDO 5MM (MISCELLANEOUS) ×2 IMPLANT
DRAPE UTILITY 15X26 W/TAPE STR (DRAPE) ×6 IMPLANT
DRAPE WARM FLUID 44X44 (DRAPE) ×2 IMPLANT
DRSG COVADERM 4X10 (GAUZE/BANDAGES/DRESSINGS) ×2 IMPLANT
ELECT CAUTERY BLADE 6.4 (BLADE) ×2 IMPLANT
ELECT REM PT RETURN 9FT ADLT (ELECTROSURGICAL) ×2
ELECTRODE REM PT RTRN 9FT ADLT (ELECTROSURGICAL) ×1 IMPLANT
FILTER SMOKE EVAC LAPAROSHD (FILTER) ×2 IMPLANT
GAUZE SPONGE 2X2 8PLY STRL LF (GAUZE/BANDAGES/DRESSINGS) ×1 IMPLANT
GLOVE BIO SURGEON STRL SZ7 (GLOVE) ×2 IMPLANT
GLOVE BIO SURGEON STRL SZ8 (GLOVE) ×2 IMPLANT
GLOVE BIOGEL PI IND STRL 6.5 (GLOVE) ×1 IMPLANT
GLOVE BIOGEL PI IND STRL 7.0 (GLOVE) ×7 IMPLANT
GLOVE BIOGEL PI IND STRL 7.5 (GLOVE) ×1 IMPLANT
GLOVE BIOGEL PI IND STRL 8 (GLOVE) ×1 IMPLANT
GLOVE BIOGEL PI INDICATOR 6.5 (GLOVE) ×1
GLOVE BIOGEL PI INDICATOR 7.0 (GLOVE) ×7
GLOVE BIOGEL PI INDICATOR 7.5 (GLOVE) ×1
GLOVE BIOGEL PI INDICATOR 8 (GLOVE) ×1
GLOVE ECLIPSE 7.0 STRL STRAW (GLOVE) ×4 IMPLANT
GLOVE SURG SS PI 6.5 STRL IVOR (GLOVE) ×2 IMPLANT
GLOVE SURG SS PI 7.0 STRL IVOR (GLOVE) ×6 IMPLANT
GOWN STRL NON-REIN LRG LVL3 (GOWN DISPOSABLE) ×12 IMPLANT
GOWN STRL REIN XL XLG (GOWN DISPOSABLE) ×2 IMPLANT
KIT BASIN OR (CUSTOM PROCEDURE TRAY) ×2 IMPLANT
KIT ROOM TURNOVER OR (KITS) ×2 IMPLANT
LIGASURE 5MM LAPAROSCOPIC (INSTRUMENTS) IMPLANT
LIGASURE IMPACT 36 18CM CVD LR (INSTRUMENTS) IMPLANT
NS IRRIG 1000ML POUR BTL (IV SOLUTION) ×6 IMPLANT
PAD ARMBOARD 7.5X6 YLW CONV (MISCELLANEOUS) ×4 IMPLANT
PENCIL BUTTON HOLSTER BLD 10FT (ELECTRODE) ×2 IMPLANT
RTRCTR WOUND ALEXIS 18CM MED (MISCELLANEOUS)
SCALPEL HARMONIC ACE (MISCELLANEOUS) IMPLANT
SCISSORS LAP 5X35 DISP (ENDOMECHANICALS) ×2 IMPLANT
SET IRRIG TUBING LAPAROSCOPIC (IRRIGATION / IRRIGATOR) IMPLANT
SLEEVE ENDOPATH XCEL 5M (ENDOMECHANICALS) ×4 IMPLANT
SPECIMEN JAR LARGE (MISCELLANEOUS) IMPLANT
SPONGE GAUZE 2X2 STER 10/PKG (GAUZE/BANDAGES/DRESSINGS) ×1
SPONGE LAP 18X18 X RAY DECT (DISPOSABLE) ×4 IMPLANT
STAPLER VISISTAT 35W (STAPLE) ×2 IMPLANT
SUCTION POOLE TIP (SUCTIONS) ×2 IMPLANT
SUT PDS AB 1 TP1 96 (SUTURE) ×4 IMPLANT
SUT PROLENE 2 0 CT2 30 (SUTURE) IMPLANT
SUT PROLENE 2 0 KS (SUTURE) IMPLANT
SUT SILK 2 0 SH CR/8 (SUTURE) IMPLANT
SUT SILK 2 0 TIES 10X30 (SUTURE) IMPLANT
SUT SILK 3 0 SH CR/8 (SUTURE) IMPLANT
SUT SILK 3 0 TIES 10X30 (SUTURE) ×4 IMPLANT
SYS LAPSCP GELPORT 120MM (MISCELLANEOUS) ×2
SYSTEM LAPSCP GELPORT 120MM (MISCELLANEOUS) ×1 IMPLANT
TOWEL OR 17X24 6PK STRL BLUE (TOWEL DISPOSABLE) ×2 IMPLANT
TOWEL OR 17X26 10 PK STRL BLUE (TOWEL DISPOSABLE) ×2 IMPLANT
TRAY FOLEY CATH 14FRSI W/METER (CATHETERS) IMPLANT
TRAY LAPAROSCOPIC (CUSTOM PROCEDURE TRAY) ×2 IMPLANT
TROCAR XCEL NON-BLD 11X100MML (ENDOMECHANICALS) IMPLANT
TROCAR XCEL NON-BLD 5MMX100MML (ENDOMECHANICALS) ×2 IMPLANT
TUBE CONNECTING 12X1/4 (SUCTIONS) ×2 IMPLANT
YANKAUER SUCT BULB TIP NO VENT (SUCTIONS) ×2 IMPLANT

## 2013-05-22 NOTE — Progress Notes (Signed)
Orthopedic Tech Progress Note Patient Details:  Ariel Stokes August 14, 1946 [id number]  Ortho Devices Type of Ortho Device: Abdominal binder Ortho Device/Splint Interventions: Ordered   Shawnie Pons 05/22/2013, 1:28 PM

## 2013-05-22 NOTE — Interval H&P Note (Signed)
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 After consideration of risks, benefits and other options for treatment, the patient has consented to  Procedure(s) with comments: LAPAROSCOPIC possible open excision of retroperitoneal lipoma possible excision of femoral portion of lipoma  (Right) - per orders use set up laparoscopic right colon resection  as a surgical intervention .  The patient's history has been reviewed, patient examined, no change in status, stable for surgery.  I have reviewed the patient's chart and labs.  Questions were answered to the patient's satisfaction.     Roneisha Stern K.

## 2013-05-22 NOTE — Anesthesia Postprocedure Evaluation (Signed)
  Anesthesia Post-op Note  Patient: Ariel Stokes  Procedure(s) Performed: Procedure(s): LAPAROSCOPIC EXCISION OF RETROPERITONEAL LIPOMA (N/A)  Patient Location: PACU  Anesthesia Type:General  Level of Consciousness: awake, oriented, sedated and patient cooperative  Airway and Oxygen Therapy: Patient Spontanous Breathing  Post-op Pain: moderate  Post-op Assessment: Post-op Vital signs reviewed, Patient's Cardiovascular Status Stable, Respiratory Function Stable, Patent Airway, No signs of Nausea or vomiting and Pain level controlled  Post-op Vital Signs: stable  Complications: No apparent anesthesia complications

## 2013-05-22 NOTE — Anesthesia Procedure Notes (Signed)
Performed by: Lucrezia Dehne E     

## 2013-05-22 NOTE — Preoperative (Signed)
Beta Blockers   Reason not to administer Beta Blockers:Not Applicable 

## 2013-05-22 NOTE — Anesthesia Preprocedure Evaluation (Signed)
Anesthesia Evaluation  Patient identified by MRN, date of birth, ID band Patient awake    Reviewed: Allergy & Precautions, H&P , NPO status , Patient's Chart, lab work & pertinent test results  Airway Mallampati: I TM Distance: >3 FB Neck ROM: full    Dental   Pulmonary          Cardiovascular Rhythm:regular Rate:Normal     Neuro/Psych  Headaches,    GI/Hepatic   Endo/Other    Renal/GU      Musculoskeletal   Abdominal   Peds  Hematology   Anesthesia Other Findings   Reproductive/Obstetrics                           Anesthesia Physical Anesthesia Plan  ASA: I  Anesthesia Plan: General   Post-op Pain Management:    Induction: Intravenous  Airway Management Planned: Oral ETT  Additional Equipment:   Intra-op Plan:   Post-operative Plan: Extubation in OR  Informed Consent: I have reviewed the patients History and Physical, chart, labs and discussed the procedure including the risks, benefits and alternatives for the proposed anesthesia with the patient or authorized representative who has indicated his/her understanding and acceptance.     Plan Discussed with: CRNA, Anesthesiologist and Surgeon  Anesthesia Plan Comments:         Anesthesia Quick Evaluation

## 2013-05-22 NOTE — Transfer of Care (Signed)
Immediate Anesthesia Transfer of Care Note  Patient: Ariel Stokes  Procedure(s) Performed: Procedure(s): LAPAROSCOPIC EXCISION OF RETROPERITONEAL LIPOMA (N/A)  Patient Location: PACU  Anesthesia Type:General  Level of Consciousness: awake, alert , oriented and sedated  Airway & Oxygen Therapy: Patient Spontanous Breathing and Patient connected to nasal cannula oxygen  Post-op Assessment: Report given to PACU RN, Post -op Vital signs reviewed and stable and Patient moving all extremities  Post vital signs: Reviewed and stable  Complications: No apparent anesthesia complications

## 2013-05-22 NOTE — Op Note (Signed)
Preop diagnosis: Right retroperitoneal lipoma Postop diagnosis: Same Procedure performed: Laparoscopic assisted converted to open excision of retroperitoneal lipoma Surgeon:Kendle Turbin K. Assistant: Dr. Elijah Birk Cornett  Anesthesia: Gen. Endotracheal Indications: This is a 67 year old female who presents with pain on her right side extending down into her leg. She has also had changes in her bowel habits. She was being treated for her back pain and had an MRI which showed a large retroperitoneal mass.Further workup with a CT scan showed a 22 cm fat density in the right retroperitoneum extending from the lower edge of the kidney along the anterior aspect of the iliac muscle down into the right groin. This displaces the colon medially. Biopsy showed no sign of liposarcoma. This is felt to represent a simple lipoma with symptoms from mass effect.  Description of procedure: The patient is brought to the operating room and placed in a supine position on the operating room table. After an adequate level of general anesthesia was obtained a Foley catheter was placed under sterile technique. The patient's abdomen and right groin were prepped with chlor prep and draped in sterile fashion using an IoBan drape. A timeout was taken to ensure the proper patient and proper procedure.A 5 mm Optiview trocar was used to cannulate the left upper quadrant of the peritoneal cavity. We insufflated CO2 maintaining a maximum pressure of 15 mm mercury. The laparoscope was inserted and the patient was tilted to her left. An additional 5 mm port was placed in the right upper quadrant and another 5 mm port was placed left of the umbilicus. We could definitely see a retroperitoneal mass that was pushing the right colon medially. I open the peritoneum overlying this mass. We dissected down bluntly to a fatty mass corresponding to the lipoma seen on the CT scan. We're able to bluntly dissect the anterior surface of this mass but had trouble  dissecting the lateral and posterior surfaces using laparoscopic blunt dissection. We then placed a GelPort device in the lower midline through a vertical incision. I inserted a hand and we were able to bluntly dissect the lipoma off of the anterior surface of the iliac muscle and away from the lateral abdominal wall.We continued down until we were at the inguinal ligament. I was unable to adequately dissect the lipoma that was extending down into the femoral canal. We removed our laparoscopic ports and our GelPort releasing the insufflation. We extended our incision up to the umbilicus. Retractors were placed so I could manually palpate the mass and dissect down into the femoral canal. We'll to bluntly dissect the lipoma out of the femoral canal. We removed the lipoma is entirety. There was some disruption of the capsule. We sent the entire specimen for pathologic examination. We thoroughly irrigated the abdomen and inspected for hemostasis. The fascia was then reapproximated with double-stranded #1 PDS suture. The subcutaneous tissues were irrigated and staples were used to close the skin. All the port sites were closed with staples as well. Dry dressings were applied. The patient was then extubated and brought to the recovery room in stable condition. All sponge, instrument, and needle counts are correct.  Wilmon Arms. [name], MD, [facility]  General/ Trauma Surgery  05/22/2013 9:55 AM

## 2013-05-23 LAB — CBC
Hemoglobin: 12.7 g/dL (ref 12.0–15.0)
RBC: 4.29 MIL/uL (ref 3.87–5.11)

## 2013-05-23 LAB — BASIC METABOLIC PANEL
CO2: 28 mEq/L (ref 19–32)
Glucose, Bld: 106 mg/dL — ABNORMAL HIGH (ref 70–99)
Potassium: 4.1 mEq/L (ref 3.5–5.1)
Sodium: 138 mEq/L (ref 135–145)

## 2013-05-23 MED ORDER — DOCUSATE SODIUM 100 MG PO CAPS
100.0000 mg | ORAL_CAPSULE | Freq: Two times a day (BID) | ORAL | Status: DC
  Administered 2013-05-23 – 2013-05-25 (×5): 100 mg via ORAL
  Filled 2013-05-23 (×6): qty 1

## 2013-05-23 NOTE — Progress Notes (Signed)
1 Day Post-Op  Subjective: Patient is very sore today.  Using PCA.  Nausea improving this morning. Foley discontinued just a little while ago.   Objective: Vital signs in last 24 hours: Temp:  [97.2 F (36.2 C)-99.8 F (37.7 C)] 99.8 F (37.7 C) (06/11 0506) Pulse Rate:  [50-96] 96 (06/11 0506) Resp:  [13-26] 21 (06/11 0506) BP: (128-158)/(66-87) 146/73 mmHg (06/11 0506) SpO2:  [90 %-100 %] 97 % (06/11 0506) Weight:  [156 lb 15.5 oz (71.2 kg)] 156 lb 15.5 oz (71.2 kg) (06/10 1152)    Intake/Output from previous day: 06/10 0701 - 06/11 0700 In: 2924 [P.O.:120; I.V.:2804] Out: 2500 [Urine:2450; Blood:50] Intake/Output this shift: Total I/O In: -  Out: 500 [Urine:500]  General appearance: alert, cooperative and no distress Resp: clear to auscultation bilaterally Cardio: regular rate and rhythm, S1, S2 normal, no murmur, click, rub or gallop GI: soft, minimally distended; hypoactive bowel sounds Dressings dry; will leave in place until POD#2  Lab Results:   Recent Labs  05/23/13 0445  WBC 7.0  HGB 12.7  HCT 39.3  PLT 140*   BMET  Recent Labs  05/23/13 0445  NA 138  K 4.1  CL 101  CO2 28  GLUCOSE 106*  BUN 8  CREATININE 0.67  CALCIUM 8.7   PT/INR No results found for this basename: LABPROT, INR,  in the last 72 hours ABG No results found for this basename: PHART, PCO2, PO2, HCO3,  in the last 72 hours  Studies/Results: No results found.  Anti-infectives: Anti-infectives   Start     Dose/Rate Route Frequency Ordered Stop   05/22/13 1200  ceFAZolin (ANCEF) IVPB 1 g/50 mL premix     1 g 100 mL/hr over 30 Minutes Intravenous Every 6 hours 05/22/13 1148 05/23/13 0025   05/22/13 0600  ceFAZolin (ANCEF) IVPB 2 g/50 mL premix     2 g 100 mL/hr over 30 Minutes Intravenous On call to O.R. 05/21/13 1419 05/22/13 0812      Assessment/Plan: s/p Procedure(s): LAPAROSCOPIC EXCISION OF RETROPERITONEAL LIPOMA (N/A) Encourage ambulation; incentive  spirometer Continue PCA until tomorrow Stay with clear liquids until nausea completely resolved. Awaiting bowel function.   LOS: 1 day    Ariel Awtrey K. 05/23/2013

## 2013-05-24 ENCOUNTER — Encounter (HOSPITAL_COMMUNITY): Payer: Self-pay | Admitting: Surgery

## 2013-05-24 LAB — CBC
HCT: 38.6 % (ref 36.0–46.0)
Hemoglobin: 12.5 g/dL (ref 12.0–15.0)
MCH: 29.8 pg (ref 26.0–34.0)
MCHC: 32.4 g/dL (ref 30.0–36.0)
MCV: 92.1 fL (ref 78.0–100.0)
Platelets: 128 10*3/uL — ABNORMAL LOW (ref 150–400)
RBC: 4.19 MIL/uL (ref 3.87–5.11)
RDW: 13.7 % (ref 11.5–15.5)
WBC: 6.9 10*3/uL (ref 4.0–10.5)

## 2013-05-24 LAB — BASIC METABOLIC PANEL
BUN: 7 mg/dL (ref 6–23)
CO2: 31 mEq/L (ref 19–32)
Calcium: 8.7 mg/dL (ref 8.4–10.5)
Chloride: 99 mEq/L (ref 96–112)
Creatinine, Ser: 0.68 mg/dL (ref 0.50–1.10)
GFR calc Af Amer: >90 mL/min (ref >90)
GFR calc non Af Amer: 90 mL/min — ABNORMAL LOW (ref >90)
Glucose, Bld: 112 mg/dL — ABNORMAL HIGH (ref 70–99)
Potassium: 4.4 mEq/L (ref 3.5–5.1)
Sodium: 137 mEq/L (ref 135–145)

## 2013-05-24 MED ORDER — MORPHINE SULFATE 2 MG/ML IJ SOLN: 2.0000 mg | INTRAMUSCULAR | Status: DC | PRN

## 2013-05-24 MED ORDER — BISACODYL 10 MG RE SUPP
10.0000 mg | Freq: Every day | RECTAL | Status: DC | PRN
  Administered 2013-05-24: 10 mg via RECTAL
  Filled 2013-05-24 (×2): qty 1

## 2013-05-24 MED ORDER — OXYCODONE-ACETAMINOPHEN 5-325 MG PO TABS
1.0000 | ORAL_TABLET | ORAL | Status: DC | PRN
  Administered 2013-05-24 – 2013-05-25 (×5): 1 via ORAL
  Filled 2013-05-24 (×5): qty 1

## 2013-05-24 NOTE — Progress Notes (Signed)
2 Days Post-Op  Subjective: Less sore, using PCA less No bowel function yet No nausea   Objective: Vital signs in last 24 hours: Temp:  [98 F (36.7 C)-99.6 F (37.6 C)] 98 F (36.7 C) (06/12 0500) Pulse Rate:  [82-98] 97 (06/12 0500) Resp:  [16-26] 16 (06/12 0500) BP: (143-159)/(75-80) 158/80 mmHg (06/12 0500) SpO2:  [96 %-98 %] 97 % (06/12 0500)    Intake/Output from previous day: 06/11 0701 - 06/12 0700 In: 2522.3 [P.O.:255; I.V.:2267.3] Out: 2575 [Urine:2575] Intake/Output this shift:    General appearance: alert, cooperative and no distress Resp: clear to auscultation bilaterally GI: soft, minimally distended; hypoactive bowel sounds Incisions c/d/i  Lab Results:   Recent Labs  05/23/13 0445 05/24/13 0425  WBC 7.0 6.9  HGB 12.7 12.5  HCT 39.3 38.6  PLT 140* 128*   BMET  Recent Labs  05/23/13 0445 05/24/13 0425  NA 138 137  K 4.1 4.4  CL 101 99  CO2 28 31  GLUCOSE 106* 112*  BUN 8 7  CREATININE 0.67 0.68  CALCIUM 8.7 8.7   PT/INR No results found for this basename: LABPROT, INR,  in the last 72 hours ABG No results found for this basename: PHART, PCO2, PO2, HCO3,  in the last 72 hours  Studies/Results: No results found.  Anti-infectives: Anti-infectives   Start     Dose/Rate Route Frequency Ordered Stop   05/22/13 1200  ceFAZolin (ANCEF) IVPB 1 g/50 mL premix     1 g 100 mL/hr over 30 Minutes Intravenous Every 6 hours 05/22/13 1148 05/23/13 0025   05/22/13 0600  ceFAZolin (ANCEF) IVPB 2 g/50 mL premix     2 g 100 mL/hr over 30 Minutes Intravenous On call to O.R. 05/21/13 1419 05/22/13 0812      Assessment/Plan: s/p Procedure(s): LAPAROSCOPIC EXCISION OF RETROPERITONEAL LIPOMA (N/A) dulcolax suppository Full liquids D/c PCA Percocet/ break thru Morphine Saline lock   LOS: 2 days    Alie Moudy K. 05/24/2013

## 2013-05-25 MED ORDER — FLEET ENEMA 7-19 GM/118ML RE ENEM
1.0000 | ENEMA | Freq: Once | RECTAL | Status: AC
  Administered 2013-05-25: 1 via RECTAL
  Filled 2013-05-25: qty 1

## 2013-05-25 MED ORDER — OXYCODONE-ACETAMINOPHEN 5-325 MG PO TABS: 1.0000 | ORAL_TABLET | ORAL | Status: DC | PRN

## 2013-05-25 NOTE — Discharge Summary (Signed)
Physician Discharge Summary  Patient ID: Ariel Stokes MRN: [id number] DOB/AGE: November 16, 1946 67 y.o.  Admit date: 05/22/2013 Discharge date: 05/25/2013  Admission Diagnoses:Large retroperitoneal lipoma  Discharge Diagnoses: Large retroperitoneal lipoma  Active Problems:   * No active hospital problems. *   Discharged Condition: good  Hospital Course: Laparoscopic converted to open excision of large retroperitoneal lipoma with extension down into the femoral canal.  Had some significant pain control issues initially, but quickly transitioned to PO pain meds.  Has been constipated, but this not anything new for her.  She feels ready to go home and will self-medicate with Miralax at home.    Consults: None  Significant Diagnostic Studies:  Lab Results  Component Value Date   WBC 6.9 05/24/2013   HGB 12.5 05/24/2013   HCT 38.6 05/24/2013   MCV 92.1 05/24/2013   PLT 128* 05/24/2013   Lab Results  Component Value Date   CREATININE 0.68 05/24/2013   BUN 7 05/24/2013   NA 137 05/24/2013   K 4.4 05/24/2013   CL 99 05/24/2013   CO2 31 05/24/2013     Treatments: surgery: as above   Discharge Exam: Blood pressure 131/69, pulse 86, temperature 98.3 F (36.8 C), temperature source Oral, resp. rate 18, height 4' 11.84" (1.52 m), weight 156 lb 15.5 oz (71.2 kg), SpO2 96.00%. General appearance: alert and cooperative GI: mildly distended; active bowel sounds Incisions c/d/i  Disposition: 01-Home or Self Care  Discharge Orders   Future Orders Complete By Expires     Call MD for:  persistant nausea and vomiting  As directed     Call MD for:  redness, tenderness, or signs of infection (pain, swelling, redness, odor or green/yellow discharge around incision site)  As directed     Call MD for:  severe uncontrolled pain  As directed     Call MD for:  temperature >100.4  As directed     Diet general  As directed     Driving Restrictions  As directed     Comments:      Do not drive while  taking pain medications    Increase activity slowly  As directed     May shower / Bathe  As directed     May walk up steps  As directed         Medication List    TAKE these medications       ibuprofen 200 MG tablet  Commonly known as:  ADVIL,MOTRIN  Take 200 mg by mouth every 6 (six) hours as needed for pain.     OVER THE COUNTER MEDICATION  Take 1 capsule by mouth 3 (three) times daily with meals. Ox Bile Extract Gel Capsules 500mg      oxyCODONE-acetaminophen 5-325 MG per tablet  Commonly known as:  PERCOCET/ROXICET  Take 1 tablet by mouth every 4 (four) hours as needed.           Follow-up Information   Follow up with Wynona Luna., MD. Schedule an appointment as soon as possible for a visit in 1 week.   Contact information:   [address] [place] [address] [phone]       Signed: Wynona Luna. 05/25/2013, 1:24 PM

## 2013-06-01 ENCOUNTER — Encounter (INDEPENDENT_AMBULATORY_CARE_PROVIDER_SITE_OTHER): Payer: Self-pay | Admitting: Surgery

## 2013-06-01 ENCOUNTER — Ambulatory Visit (INDEPENDENT_AMBULATORY_CARE_PROVIDER_SITE_OTHER): Payer: BC Managed Care – PPO | Admitting: Surgery

## 2013-06-01 VITALS — BP 120/70 | HR 75 | Temp 97.6°F | Ht 60.0 in | Wt 152.6 lb

## 2013-06-01 DIAGNOSIS — D175 Benign lipomatous neoplasm of intra-abdominal organs: Secondary | ICD-10-CM

## 2013-06-01 MED ORDER — HYDROCODONE-ACETAMINOPHEN 5-325 MG PO TABS: 1.0000 | ORAL_TABLET | ORAL | Status: DC | PRN

## 2013-06-01 NOTE — Progress Notes (Signed)
Status post open excision of a large retroperitoneal lipoma on 05/22/13. The patient is doing reasonably well. She still takes occasional pain medication. Her bowel movements are slightly improved. She did use MiraLAX for the first few days after going home but has stopped using it the last 2 days. Her appetite is improving. Her back pain seems to have resolved. She has had chronic back pain for many years. She still has some numbness on the anterior half of her right thigh but has normal motor function.  The pathology report shows mature fat with some central fat necrosis consistent with a large retroperitoneal lipoma.  Filed Vitals:   06/01/13 1108  BP: 120/70  Pulse: 75  Temp: 97.6 F (36.4 C)   Her incisions are well-healed with no sign of infection. Staples are removed and replaced with Steri-Strips. No sign of hernia. The patient may slowly increase her level of activity. Recheck in 2-3 weeks. I did refill her pain prescription.  Wilmon Arms. [name], MD, [facility]  General/ Trauma Surgery  06/01/2013 12:54 PM

## 2013-06-22 ENCOUNTER — Ambulatory Visit (INDEPENDENT_AMBULATORY_CARE_PROVIDER_SITE_OTHER): Payer: BC Managed Care – PPO | Admitting: Surgery

## 2013-06-22 ENCOUNTER — Encounter (INDEPENDENT_AMBULATORY_CARE_PROVIDER_SITE_OTHER): Payer: Self-pay | Admitting: Surgery

## 2013-06-22 VITALS — BP 140/78 | HR 80 | Temp 97.5°F | Resp 16 | Ht 60.5 in | Wt 147.6 lb

## 2013-06-22 DIAGNOSIS — D175 Benign lipomatous neoplasm of intra-abdominal organs: Secondary | ICD-10-CM

## 2013-06-22 NOTE — Progress Notes (Signed)
The patient is here for another follow-up after resection of a large retroperitoneal lipoma on 05/22/13.  Her right anterior thigh numbness remains, but seems to be improving.  The involved area of numbness seems to be smaller.  Her back remains pain-free, of which she is the most happy.  Her right leg strength has returned to normal.  Her bowel movements have significantly improved since surgery.    Her incision is well-healed with no sign of infection.  No abdominal tenderness.  No palpable masses.  S/p excision of lipoma from retroperitoneum.  Doing very well.  Right thigh numbness is likely due to some sensory nerve compression during the dissection of the femoral portion of the lipoma.  This seems to be improving.   Resume activity as tolerated.  Follow-up as needed.  Wilmon Arms. [name], MD, [facility] Surgery  General/ Trauma Surgery  06/22/2013 6:40 PM

## 2013-07-18 ENCOUNTER — Other Ambulatory Visit: Payer: Self-pay

## 2013-10-18 ENCOUNTER — Other Ambulatory Visit: Payer: Self-pay

## 2014-06-26 ENCOUNTER — Other Ambulatory Visit: Payer: Self-pay | Admitting: Family Medicine

## 2014-06-26 ENCOUNTER — Other Ambulatory Visit (HOSPITAL_COMMUNITY)
Admission: RE | Admit: 2014-06-26 | Discharge: 2014-06-26 | Disposition: A | Discharge status: Home / Self Care | Payer: Medicare Other | Source: Ambulatory Visit | Attending: Family Medicine | Admitting: Family Medicine

## 2014-06-26 DIAGNOSIS — Z1151 Encounter for screening for human papillomavirus (HPV): Secondary | ICD-10-CM | POA: Insufficient documentation

## 2014-06-26 DIAGNOSIS — Z124 Encounter for screening for malignant neoplasm of cervix: Secondary | ICD-10-CM | POA: Insufficient documentation

## 2014-06-27 ENCOUNTER — Other Ambulatory Visit: Payer: Self-pay | Admitting: Family Medicine

## 2014-06-27 DIAGNOSIS — E2839 Other primary ovarian failure: Secondary | ICD-10-CM

## 2014-06-27 DIAGNOSIS — Z1231 Encounter for screening mammogram for malignant neoplasm of breast: Secondary | ICD-10-CM

## 2014-06-28 LAB — CYTOLOGY - PAP

## 2014-07-10 ENCOUNTER — Ambulatory Visit
Admission: RE | Admit: 2014-07-10 | Discharge: 2014-07-10 | Disposition: A | Discharge status: Home / Self Care | Payer: Medicare Other | Source: Ambulatory Visit | Attending: Family Medicine | Admitting: Family Medicine

## 2014-07-10 DIAGNOSIS — Z1231 Encounter for screening mammogram for malignant neoplasm of breast: Secondary | ICD-10-CM

## 2014-07-10 DIAGNOSIS — E2839 Other primary ovarian failure: Secondary | ICD-10-CM

## 2015-12-02 ENCOUNTER — Other Ambulatory Visit: Payer: Self-pay

## 2015-12-02 DIAGNOSIS — Z1231 Encounter for screening mammogram for malignant neoplasm of breast: Secondary | ICD-10-CM

## 2015-12-31 ENCOUNTER — Ambulatory Visit: Payer: Self-pay

## 2016-01-16 ENCOUNTER — Ambulatory Visit
Admission: RE | Admit: 2016-01-16 | Discharge: 2016-01-16 | Disposition: A | Discharge status: Home / Self Care | Payer: Medicare Other | Source: Ambulatory Visit

## 2016-01-16 DIAGNOSIS — Z1231 Encounter for screening mammogram for malignant neoplasm of breast: Secondary | ICD-10-CM

## 2016-12-20 ENCOUNTER — Other Ambulatory Visit: Payer: Self-pay | Admitting: Family Medicine

## 2016-12-20 DIAGNOSIS — Z1231 Encounter for screening mammogram for malignant neoplasm of breast: Secondary | ICD-10-CM

## 2017-01-19 ENCOUNTER — Ambulatory Visit
Admission: RE | Admit: 2017-01-19 | Discharge: 2017-01-19 | Disposition: A | Discharge status: Home / Self Care | Payer: Medicare Other | Source: Ambulatory Visit | Attending: Family Medicine | Admitting: Family Medicine

## 2017-01-19 DIAGNOSIS — Z1231 Encounter for screening mammogram for malignant neoplasm of breast: Secondary | ICD-10-CM

## 2017-02-16 ENCOUNTER — Other Ambulatory Visit: Payer: Self-pay | Admitting: Family Medicine

## 2017-02-16 DIAGNOSIS — M8588 Other specified disorders of bone density and structure, other site: Secondary | ICD-10-CM

## 2017-02-23 ENCOUNTER — Ambulatory Visit
Admission: RE | Admit: 2017-02-23 | Discharge: 2017-02-23 | Disposition: A | Discharge status: Home / Self Care | Payer: Medicare Other | Source: Ambulatory Visit | Attending: Family Medicine | Admitting: Family Medicine

## 2017-02-23 DIAGNOSIS — M8588 Other specified disorders of bone density and structure, other site: Secondary | ICD-10-CM

## 2017-05-27 ENCOUNTER — Encounter: Payer: Self-pay | Admitting: Physician Assistant

## 2017-05-27 ENCOUNTER — Ambulatory Visit (INDEPENDENT_AMBULATORY_CARE_PROVIDER_SITE_OTHER): Payer: Medicare Other | Admitting: Physician Assistant

## 2017-05-27 VITALS — BP 133/83 | HR 80 | Temp 98.1°F | Resp 16 | Ht 60.47 in | Wt 114.2 lb

## 2017-05-27 DIAGNOSIS — R05 Cough: Secondary | ICD-10-CM

## 2017-05-27 DIAGNOSIS — J0100 Acute maxillary sinusitis, unspecified: Secondary | ICD-10-CM | POA: Diagnosis not present

## 2017-05-27 DIAGNOSIS — R059 Cough, unspecified: Secondary | ICD-10-CM

## 2017-05-27 DIAGNOSIS — J309 Allergic rhinitis, unspecified: Secondary | ICD-10-CM

## 2017-05-27 MED ORDER — HYDROCODONE-HOMATROPINE 5-1.5 MG/5ML PO SYRP: 5.0000 mL | ORAL_SOLUTION | Freq: Three times a day (TID) | ORAL | 0 refills | Status: DC | PRN

## 2017-05-27 MED ORDER — BENZONATATE 100 MG PO CAPS: 100.0000 mg | ORAL_CAPSULE | Freq: Three times a day (TID) | ORAL | 0 refills | Status: DC | PRN

## 2017-05-27 MED ORDER — DOXYCYCLINE HYCLATE 100 MG PO CAPS: 100.0000 mg | ORAL_CAPSULE | Freq: Two times a day (BID) | ORAL | 0 refills | Status: DC

## 2017-05-27 MED ORDER — FLUTICASONE PROPIONATE 50 MCG/ACT NA SUSP: 2.0000 | Freq: Every day | NASAL | 6 refills | Status: DC

## 2017-05-27 NOTE — Patient Instructions (Addendum)
We are going to treat you with doxycycline for underlying bacterial etiology. Please take as prescribed. Use sunscreen while on this medication.  I recommend you rest, drink plenty of fluids, eat light meals including soups. You may use cough syrup at night for your cough and sore throat, Tessalon pearls during the day. Be aware that cough syrup can definitely make you drowsy and sleepy so do not drive or operate any heavy machinery if it is affecting you during the day. After you complete the antibiotic, you may use flonase daily for underlying allergies to decrease risk of developing further sinus infections. Please return to clinic if symptoms worsen, do not improve, or as needed. Enjoy your trip to [place]!!!!      Sinusitis, Adult Sinusitis is soreness and inflammation of your sinuses. Sinuses are hollow spaces in the bones around your face. They are located:  Around your eyes.  In the middle of your forehead.  Behind your nose.  In your cheekbones.  Your sinuses and nasal passages are lined with a stringy fluid (mucus). Mucus normally drains out of your sinuses. When your nasal tissues get inflamed or swollen, the mucus can get trapped or blocked so air cannot flow through your sinuses. This lets bacteria, viruses, and funguses grow, and that leads to infection. Follow these instructions at home: Medicines  Take, use, or apply over-the-counter and prescription medicines only as told by your doctor. These may include nasal sprays.  If you were prescribed an antibiotic medicine, take it as told by your doctor. Do not stop taking the antibiotic even if you start to feel better. Hydrate and Humidify  Drink enough water to keep your pee (urine) clear or pale yellow.  Use a cool mist humidifier to keep the humidity level in your home above 50%.  Breathe in steam for 10-15 minutes, 3-4 times a day or as told by your doctor. You can do this in the bathroom while a hot shower is  running.  Try not to spend time in cool or dry air. Rest  Rest as much as possible.  Sleep with your head raised (elevated).  Make sure to get enough sleep each night. General instructions  Put a warm, moist washcloth on your face 3-4 times a day or as told by your doctor. This will help with discomfort.  Wash your hands often with soap and water. If there is no soap and water, use hand sanitizer.  Do not smoke. Avoid being around people who are smoking (secondhand smoke).  Keep all follow-up visits as told by your doctor. This is important. Contact a doctor if:  You have a fever.  Your symptoms get worse.  Your symptoms do not get better within 10 days. Get help right away if:  You have a very bad headache.  You cannot stop throwing up (vomiting).  You have pain or swelling around your face or eyes.  You have trouble seeing.  You feel confused.  Your neck is stiff.  You have trouble breathing. This information is not intended to replace advice given to you by your health care provider. Make sure you discuss any questions you have with your health care provider. Document Released: 05/17/2008 Document Revised: 07/25/2016 Document Reviewed: 09/24/2015 Elsevier Interactive Patient Education  2018 [place].  Cough, Adult A cough helps to clear your throat and lungs. A cough may last only 2-3 weeks (acute), or it may last longer than 8 weeks (chronic). Many different things can cause a cough.  A cough may be a sign of an illness or another medical condition. Follow these instructions at home:  Pay attention to any changes in your cough.  Take medicines only as told by your doctor. ? If you were prescribed an antibiotic medicine, take it as told by your doctor. Do not stop taking it even if you start to feel better. ? Talk with your doctor before you try using a cough medicine.  Drink enough fluid to keep your pee (urine) clear or pale yellow.  If the air is  dry, use a cold steam vaporizer or humidifier in your home.  Stay away from things that make you cough at work or at home.  If your cough is worse at night, try using extra pillows to raise your head up higher while you sleep.  Do not smoke, and try not to be around smoke. If you need help quitting, ask your doctor.  Do not have caffeine.  Do not drink alcohol.  Rest as needed. Contact a doctor if:  You have new problems (symptoms).  You cough up yellow fluid (pus).  Your cough does not get better after 2-3 weeks, or your cough gets worse.  Medicine does not help your cough and you are not sleeping well.  You have pain that gets worse or pain that is not helped with medicine.  You have a fever.  You are losing weight and you do not know why.  You have night sweats. Get help right away if:  You cough up blood.  You have trouble breathing.  Your heartbeat is very fast. This information is not intended to replace advice given to you by your health care provider. Make sure you discuss any questions you have with your health care provider. Document Released: 08/12/2011 Document Revised: 05/06/2016 Document Reviewed: 02/05/2015 Elsevier Interactive Patient Education  2018 [employer].    IF you received an x-ray today, you will receive an invoice from [facility]. Please contact [facility] at [phone] with questions or concerns regarding your invoice.   IF you received labwork today, you will receive an invoice from [facility]. Please contact [facility] at [phone] with questions or concerns regarding your invoice.   Our billing staff will not be able to assist you with questions regarding bills from these companies.  You will be contacted with the lab results as soon as they are available. The fastest way to get your results is to activate your My Chart account. Instructions are located on the last page of this paperwork. If you have not heard from  [place] regarding the results in 2 weeks, please contact this office.

## 2017-05-27 NOTE — Progress Notes (Signed)
MRN: [id number] DOB: [birth year]  Subjective:   Ariel Stokes is a 71 y.o. female presenting for chief complaint of Cough (X 1 day) and Sinus Problem (X 1 week) .  Reports 10 history of  sinus congestion, sinus pain, rhinorrhea, ear fullness, sore throat and cough. Notes the sore throat, runny nose, and ear fullness have improved but the cough has worsened. It is now productive. She is feeling a minor fever. It is disrupting her sleep.  Has tried delsym but it caused her to stay awake. The sinus congestion has also persisted. Denies wheezing, shortness of breath, chest tightness and chest pain, night sweats, chills, nausea, vomiting, abdominal pain and diarrhea. Has had sick contact with son. Has history of seasonal allergies, takes loratadine daily. No history of asthma. Denies smoking. Denies any other aggravating or relieving factors, no other questions or concerns.  Ariel Stokes has a current medication list which includes the following prescription(s): OVER THE COUNTER MEDICATION, benzonatate, doxycycline, fluticasone, and hydrocodone-homatropine. Also is allergic to other.  [name]  has a past medical history of Bruises easily; Episode of dizziness; and Headache(784.0). Also  has a past surgical history that includes Neck surgery; spinal fusion neck; Cholecystectomy; Knee arthroscopy (Left); Dilation and curettage of uterus; Aspiration biopsy; Lipoma excision (05/22/2013); and Colon resection (N/A, 05/22/2013).   Objective:   Vitals: BP 133/83 (BP Location: Right Arm, Patient Position: Sitting, Cuff Size: Normal)   Pulse 80   Temp 98.1 F (36.7 C) (Oral)   Resp 16   Ht 5' 0.47" (1.536 m)   Wt 114 lb 3.2 oz (51.8 kg)   SpO2 96%   BMI 21.96 kg/m   Physical Exam  Constitutional: She is oriented to person, place, and time. She appears well-developed and well-nourished. No distress.  HENT:  Head: Normocephalic and atraumatic.  Right Ear: Tympanic membrane, external ear and ear  canal normal.  Left Ear: Tympanic membrane, external ear and ear canal normal.  Nose: Mucosal edema (mild bilaterally) present. Right sinus exhibits maxillary sinus tenderness. Right sinus exhibits no frontal sinus tenderness. Left sinus exhibits no maxillary sinus tenderness and no frontal sinus tenderness.  Mouth/Throat: Uvula is midline and mucous membranes are normal. Posterior oropharyngeal erythema present. Tonsils are 1+ on the right. Tonsils are 1+ on the left. No tonsillar exudate.  Eyes: Conjunctivae are normal.  Neck: Normal range of motion.  Cardiovascular: Normal rate, regular rhythm and normal heart sounds.   Pulmonary/Chest: Effort normal and breath sounds normal. She has no wheezes. She has no rhonchi. She has no rales.  Lymphadenopathy:       Head (right side): No submental, no submandibular, no tonsillar, no preauricular, no posterior auricular and no occipital adenopathy present.       Head (left side): No submental, no submandibular, no tonsillar, no preauricular, no posterior auricular and no occipital adenopathy present.    She has no cervical adenopathy.       Right: No supraclavicular adenopathy present.       Left: No supraclavicular adenopathy present.  Neurological: She is alert and oriented to person, place, and time.  Skin: Skin is warm and dry.  Psychiatric: She has a normal mood and affect.  Vitals reviewed.   No results found for this or any previous visit (from the past 24 hour(s)).  Assessment and Plan :  1. Acute maxillary sinusitis, recurrence not specified 2. Cough Due to duration and worsening of symptoms, concerned for secondary infection. Will cover empirically for underlying bacterial etiology.  Will also give pt symptomatic tx for cough. Instructed to return to clinic if symptoms worsen, do not improve in 10 days, or as needed - doxycycline (VIBRAMYCIN) 100 MG capsule; Take 1 capsule (100 mg total) by mouth 2 (two) times daily.  Dispense: 20 capsule;  Refill: 0 - HYDROcodone-homatropine (HYCODAN) 5-1.5 MG/5ML syrup; Take 5 mLs by mouth every 8 (eight) hours as needed for cough.  Dispense: 120 mL; Refill: 0 - benzonatate (TESSALON) 100 MG capsule; Take 1-2 capsules (100-200 mg total) by mouth 3 (three) times daily as needed for cough.  Dispense: 40 capsule; Refill: 0  3. Allergic rhinitis, unspecified seasonality, unspecified trigger Instructed to use flonase for nasal congestion in the future for better control of seasonal allergies.  - fluticasone (FLONASE) 50 MCG/ACT nasal spray; Place 2 sprays into both nostrils daily.  Dispense: 16 g; Refill: [place], PA-C  [facility] [place] 05/27/2017 1:27 PM

## 2018-02-20 ENCOUNTER — Other Ambulatory Visit: Payer: Self-pay | Admitting: Family Medicine

## 2018-02-20 DIAGNOSIS — Z1231 Encounter for screening mammogram for malignant neoplasm of breast: Secondary | ICD-10-CM

## 2018-03-15 ENCOUNTER — Ambulatory Visit
Admission: RE | Admit: 2018-03-15 | Discharge: 2018-03-15 | Disposition: A | Discharge status: Home / Self Care | Payer: Medicare Other | Source: Ambulatory Visit | Attending: Family Medicine | Admitting: Family Medicine

## 2018-03-15 DIAGNOSIS — Z1231 Encounter for screening mammogram for malignant neoplasm of breast: Secondary | ICD-10-CM

## 2020-01-20 ENCOUNTER — Ambulatory Visit: Payer: Medicare PPO | Attending: Internal Medicine

## 2020-01-20 DIAGNOSIS — Z23 Encounter for immunization: Secondary | ICD-10-CM

## 2020-01-20 NOTE — Progress Notes (Signed)
   Covid-19 Vaccination Clinic  Name:  Ariel Stokes    MRN: [id number] DOB: 1946-06-03  01/20/2020  Ariel Stokes was observed post Covid-19 immunization for 15 minutes without incidence. She was provided with Vaccine Information Sheet and instruction to access the V-Safe system.   Ariel Stokes was instructed to call 911 with any severe reactions post vaccine: Marland Kitchen Difficulty breathing  . Swelling of your face and throat  . A fast heartbeat  . A bad rash all over your body  . Dizziness and weakness    Immunizations Administered    Name Date Dose VIS Date Route   Pfizer COVID-19 Vaccine 01/20/2020  3:49 PM 0.3 mL 11/23/2019 Intramuscular   Manufacturer: [place]   Lot: [id number]   [place]: [id number]

## 2020-02-04 ENCOUNTER — Ambulatory Visit: Payer: Medicare Other

## 2020-02-14 ENCOUNTER — Ambulatory Visit: Payer: Medicare PPO | Attending: Internal Medicine

## 2020-02-14 DIAGNOSIS — Z23 Encounter for immunization: Secondary | ICD-10-CM | POA: Insufficient documentation

## 2020-02-14 NOTE — Progress Notes (Signed)
   Covid-19 Vaccination Clinic  Name:  Ariel Stokes    MRN: [id number] DOB: 12-05-1946  02/14/2020  Ariel Stokes was observed post Covid-19 immunization for 15 minutes without incident. She was provided with Vaccine Information Sheet and instruction to access the V-Safe system.   Ariel Stokes was instructed to call 911 with any severe reactions post vaccine: Marland Kitchen Difficulty breathing  . Swelling of face and throat  . A fast heartbeat  . A bad rash all over body  . Dizziness and weakness   Immunizations Administered    Name Date Dose VIS Date Route   Pfizer COVID-19 Vaccine 02/14/2020 10:06 AM 0.3 mL 11/23/2019 Intramuscular   Manufacturer: [place]   Lot: [id number]   [place]: [id number]

## 2020-03-17 ENCOUNTER — Other Ambulatory Visit: Payer: Self-pay | Admitting: Family Medicine

## 2020-03-17 DIAGNOSIS — Z1231 Encounter for screening mammogram for malignant neoplasm of breast: Secondary | ICD-10-CM

## 2020-03-25 ENCOUNTER — Other Ambulatory Visit: Payer: Self-pay

## 2020-03-25 ENCOUNTER — Ambulatory Visit
Admission: RE | Admit: 2020-03-25 | Discharge: 2020-03-25 | Disposition: A | Discharge status: Home / Self Care | Payer: Medicare PPO | Source: Ambulatory Visit | Attending: Family Medicine | Admitting: Family Medicine

## 2020-03-25 DIAGNOSIS — Z1231 Encounter for screening mammogram for malignant neoplasm of breast: Secondary | ICD-10-CM

## 2020-04-17 ENCOUNTER — Emergency Department (HOSPITAL_BASED_OUTPATIENT_CLINIC_OR_DEPARTMENT_OTHER)
Admission: EM | Admit: 2020-04-17 | Discharge: 2020-04-17 | Disposition: A | Discharge status: Home / Self Care | Payer: Medicare PPO | Attending: Emergency Medicine | Admitting: Emergency Medicine

## 2020-04-17 ENCOUNTER — Other Ambulatory Visit: Payer: Self-pay

## 2020-04-17 ENCOUNTER — Emergency Department (HOSPITAL_BASED_OUTPATIENT_CLINIC_OR_DEPARTMENT_OTHER): Payer: Medicare PPO

## 2020-04-17 ENCOUNTER — Encounter (HOSPITAL_BASED_OUTPATIENT_CLINIC_OR_DEPARTMENT_OTHER): Payer: Self-pay

## 2020-04-17 DIAGNOSIS — M47812 Spondylosis without myelopathy or radiculopathy, cervical region: Secondary | ICD-10-CM

## 2020-04-17 DIAGNOSIS — Z79899 Other long term (current) drug therapy: Secondary | ICD-10-CM | POA: Insufficient documentation

## 2020-04-17 DIAGNOSIS — G44221 Chronic tension-type headache, intractable: Secondary | ICD-10-CM | POA: Insufficient documentation

## 2020-04-17 DIAGNOSIS — R519 Headache, unspecified: Secondary | ICD-10-CM | POA: Diagnosis present

## 2020-04-17 DIAGNOSIS — G8929 Other chronic pain: Secondary | ICD-10-CM

## 2020-04-17 MED ORDER — DEXAMETHASONE SODIUM PHOSPHATE 10 MG/ML IJ SOLN
10.0000 mg | Freq: Once | INTRAMUSCULAR | Status: AC
  Administered 2020-04-17: 10 mg via INTRAVENOUS
  Filled 2020-04-17: qty 1

## 2020-04-17 MED ORDER — HYDROMORPHONE HCL 1 MG/ML IJ SOLN
0.5000 mg | Freq: Once | INTRAMUSCULAR | Status: AC
  Administered 2020-04-17: 0.5 mg via INTRAVENOUS
  Filled 2020-04-17: qty 1

## 2020-04-17 MED ORDER — HYDROCODONE-ACETAMINOPHEN 5-325 MG PO TABS: 1.0000 | ORAL_TABLET | Freq: Four times a day (QID) | ORAL | 0 refills | Status: AC | PRN

## 2020-04-17 MED ORDER — SODIUM CHLORIDE 0.9 % IV BOLUS
500.0000 mL | Freq: Once | INTRAVENOUS | Status: AC
  Administered 2020-04-17: 500 mL via INTRAVENOUS

## 2020-04-17 MED ORDER — METOCLOPRAMIDE HCL 5 MG/ML IJ SOLN
5.0000 mg | Freq: Once | INTRAMUSCULAR | Status: AC
  Administered 2020-04-17: 5 mg via INTRAVENOUS
  Filled 2020-04-17: qty 2

## 2020-04-17 MED ORDER — KETOROLAC TROMETHAMINE 15 MG/ML IJ SOLN
15.0000 mg | Freq: Once | INTRAMUSCULAR | Status: AC
  Administered 2020-04-17: 15 mg via INTRAVENOUS
  Filled 2020-04-17: qty 1

## 2020-04-17 NOTE — ED Triage Notes (Signed)
Pt states headache right side of head and neck for 9 weeks, trying to get in to see neurologist.  Today headache worse, took advil at 11am. No relief.  Went to [place] walk in , provider injected lidocaine to neck, no relief.  Sent here for further eval.

## 2020-04-17 NOTE — ED Provider Notes (Signed)
[facility] EMERGENCY DEPARTMENT Provider Note   CSN: [id number] Arrival date & time: 04/17/20  1731     History Chief Complaint  Patient presents with  . Migraine    Ariel Stokes is a 74 y.o. female.  HPI   74 year old female with headache/neck pain.  This is been ongoing issue for weeks to months.  Pain is in the right neck/right skull base.  Sniffily worse with movement.  She denies any acute trauma or strain.  Denies any radicular symptoms.  No fevers or chills.  No acute visual changes.  Has been trying over-the-counter pain medicines without much improvement.  Is trying to get appointments with neurosurgery and neurology.  Went to a walk-in clinic earlier today and had an injection but does not feel like it helped with her symptoms significantly.  Past Medical History:  Diagnosis Date  . Bruises easily    has since she can remember  . Episode of dizziness   . [id number]     Patient Active Problem List   Diagnosis Date Noted  . 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 ASPIRATION BIOPSY    . CHOLECYSTECTOMY    . COLON RESECTION N/A 05/22/2013   Procedure: LAPAROSCOPIC EXCISION OF RETROPERITONEAL LIPOMA;  Surgeon: Imogene Burn. Georgette Dover, MD;  Location: [facility];  Service: General;  Laterality: N/A;  . DILATION AND CURETTAGE OF UTERUS    . KNEE ARTHROSCOPY Left   . LIPOMA EXCISION  05/22/2013   Dr Georgette Dover  . NECK SURGERY    . spinal fusion neck       OB History   No obstetric history on file.     History reviewed. No pertinent family history.  Social History   Tobacco Use  . Smoking status: Never Smoker  . Smokeless tobacco: Never Used  Substance Use Topics  . Alcohol use: No    Comment: only occasionally per week  . Drug use: No    Home Medications Prior to Admission medications   Medication Sig Start Date End Date Taking?  Authorizing Provider  benzonatate (TESSALON) 100 MG capsule Take 1-2 capsules (100-200 mg total) by mouth 3 (three) times daily as needed for cough. 05/27/17   Tenna Delaine D, PA-C  doxycycline (VIBRAMYCIN) 100 MG capsule Take 1 capsule (100 mg total) by mouth 2 (two) times daily. 05/27/17   Tenna Delaine D, PA-C  fluticasone (FLONASE) 50 MCG/ACT nasal spray Place 2 sprays into both nostrils daily. 05/27/17   Tenna Delaine D, PA-C  HYDROcodone-homatropine (HYCODAN) 5-1.5 MG/5ML syrup Take 5 mLs by mouth every 8 (eight) hours as needed for cough. 05/27/17   Tenna Delaine D, PA-C  OVER THE COUNTER MEDICATION Take 1 capsule by mouth 3 (three) times daily with meals. Ox Bile Extract Gel Capsules 500mg     [provider]    Allergies    Other  Review of Systems   Review of Systems All systems reviewed and negative, other than as noted in HPI.  Physical Exam Updated Vital Signs BP (!) 148/86   Pulse 69   Temp 98.3 F (36.8 C) (Oral)   Resp 15   Ht 5' (1.524 m)   Wt 52.2 kg   SpO2 99%   BMI 22.46 kg/m   Physical Exam Vitals and nursing note reviewed.  Constitutional:      General: She is not in acute distress.  Appearance: She is well-developed.  HENT:     Head: Normocephalic and atraumatic.  Eyes:     General:        Right eye: No discharge.        Left eye: No discharge.     Conjunctiva/sclera: Conjunctivae normal.  Cardiovascular:     Rate and Rhythm: Normal rate and regular rhythm.     Heart sounds: Normal heart sounds. No murmur. No friction rub. No gallop.   Pulmonary:     Effort: Pulmonary effort is normal. No respiratory distress.     Breath sounds: Normal breath sounds.  Abdominal:     General: There is no distension.     Palpations: Abdomen is soft.     Tenderness: There is no abdominal tenderness.  Musculoskeletal:        General: No tenderness.     Cervical back: Neck supple.  Skin:    General: Skin is warm and dry.  Neurological:      Mental Status: She is alert and oriented to person, place, and time.     Cranial Nerves: No cranial nerve deficit.     Sensory: No sensory deficit.     Motor: No weakness.     Coordination: Coordination normal.  Psychiatric:        Behavior: Behavior normal.        Thought Content: Thought content normal.     ED Results / Procedures / Treatments   Labs (all labs ordered are listed, but only abnormal results are displayed) Labs Reviewed - No data to display  EKG None  Radiology CT Head Wo Contrast  Result Date: 04/17/2020 CLINICAL DATA:  74 year old female with headache. EXAM: CT HEAD WITHOUT CONTRAST CT CERVICAL SPINE WITHOUT CONTRAST TECHNIQUE: Multidetector CT imaging of the head and cervical spine was performed following the standard protocol without intravenous contrast. Multiplanar CT image reconstructions of the cervical spine were also generated. COMPARISON:  None. FINDINGS: CT HEAD FINDINGS Brain: The ventricles and sulci appropriate size for patient's age. Mild periventricular and deep white matter chronic microvascular ischemic changes noted. There is no acute intracranial hemorrhage. No mass effect or midline shift. No extra-axial fluid collection. Vascular: No hyperdense vessel or unexpected calcification. Skull: Normal. Negative for fracture or focal lesion. Sinuses/Orbits: No acute finding. Other: None CT CERVICAL SPINE FINDINGS Alignment: No acute subluxation. Skull base and vertebrae: No acute fracture. Osteopenia. Soft tissues and spinal canal: No prevertebral fluid or swelling. No visible canal hematoma. Disc levels: C4-C6 bony fusion. Severe degenerative changes at C3-C4 with endplate irregularity and displaced narrowing. There is grade 1 C3-C4 retrolisthesis. Upper chest: Negative. Other: Several small pockets of soft tissue air in the right posterior upper neck of indeterminate etiology, possibly related to a penetrating injury. There is mild haziness of the overlying  subcutaneous fat (10/3). No fluid collection or hematoma. IMPRESSION: 1. No acute intracranial pathology. 2. No acute/traumatic cervical spine pathology. 3. C4-C6 bony fusion. Severe degenerative changes at C3-C4. 4. Several small pockets of soft tissue air in the right posterior upper neck of indeterminate etiology, possibly related to a penetrating injury. No fluid collection or hematoma. Electronically Signed   By: Anner Crete M.D.   On: 04/17/2020 20:36   CT Cervical Spine Wo Contrast  Result Date: 04/17/2020 CLINICAL DATA:  74 year old female with headache. EXAM: CT HEAD WITHOUT CONTRAST CT CERVICAL SPINE WITHOUT CONTRAST TECHNIQUE: Multidetector CT imaging of the head and cervical spine was performed following the standard protocol without intravenous contrast. Multiplanar  CT image reconstructions of the cervical spine were also generated. COMPARISON:  None. FINDINGS: CT HEAD FINDINGS Brain: The ventricles and sulci appropriate size for patient's age. Mild periventricular and deep white matter chronic microvascular ischemic changes noted. There is no acute intracranial hemorrhage. No mass effect or midline shift. No extra-axial fluid collection. Vascular: No hyperdense vessel or unexpected calcification. Skull: Normal. Negative for fracture or focal lesion. Sinuses/Orbits: No acute finding. Other: None CT CERVICAL SPINE FINDINGS Alignment: No acute subluxation. Skull base and vertebrae: No acute fracture. Osteopenia. Soft tissues and spinal canal: No prevertebral fluid or swelling. No visible canal hematoma. Disc levels: C4-C6 bony fusion. Severe degenerative changes at C3-C4 with endplate irregularity and displaced narrowing. There is grade 1 C3-C4 retrolisthesis. Upper chest: Negative. Other: Several small pockets of soft tissue air in the right posterior upper neck of indeterminate etiology, possibly related to a penetrating injury. There is mild haziness of the overlying subcutaneous fat (10/3). No  fluid collection or hematoma. IMPRESSION: 1. No acute intracranial pathology. 2. No acute/traumatic cervical spine pathology. 3. C4-C6 bony fusion. Severe degenerative changes at C3-C4. 4. Several small pockets of soft tissue air in the right posterior upper neck of indeterminate etiology, possibly related to a penetrating injury. No fluid collection or hematoma. Electronically Signed   By: Anner Crete M.D.   On: 04/17/2020 20:36    Procedures Procedures (including critical care time)  Medications Ordered in ED Medications  ketorolac (TORADOL) 15 MG/ML injection 15 mg (15 mg Intravenous Given 04/17/20 2036)  metoCLOPramide (REGLAN) injection 5 mg (5 mg Intravenous Given 04/17/20 2036)  HYDROmorphone (DILAUDID) injection 0.5 mg (0.5 mg Intravenous Given 04/17/20 2035)  dexamethasone (DECADRON) injection 10 mg (10 mg Intravenous Given 04/17/20 2036)  sodium chloride 0.9 % bolus 500 mL (500 mLs Intravenous New Bag/Given 04/17/20 2035)    ED Course  I have reviewed the triage vital signs and the nursing notes.  Pertinent labs & imaging results that were available during my care of the patient were reviewed by me and considered in my medical decision making (see chart for details).    MDM Rules/Calculators/A&P                      74 year old female with headache/neck pain.  I suspect that symptoms related to her cervical spine.  Severe degenerative changes noted on imaging.  Subcutaneous air is from an injection she received in clinic prior to coming to the ED. Nothing on exam to suggest infection or penetrating injury otherwise.Her neuro exam is nonfocal. She has been taking NSAIDs sporadically for the pain. Received a dose of decadron here in the ED. Will place her continue NSAIDs. PRN vicodin. Reports upcoming appointment with neurology although not until next month. Previously seen Dr Sherwood Gambler, neurosurgery years ago. She is going to try to for with NS as well.   Final Clinical Impression(s) /  ED Diagnoses Final diagnoses:  Chronic intractable headache, unspecified headache type  Osteoarthritis of cervical spine, unspecified spinal osteoarthritis complication status    Rx / DC Orders ED Discharge Orders    None       Virgel Manifold, MD 04/21/20 1217

## 2020-04-17 NOTE — Discharge Instructions (Signed)
I think your symptoms are related to your cervical spine.  You have severe degenerative changes noted on your imaging.  I would still keep your appointment with neurology but think that you also need to follow-up with neurosurgery.  The air that radiology mentions on your report is related to the injection that you had earlier today. This will resolve and is not concerning.   You received a dose of dexamethasone here in the emergency room today.  This is a long-acting steroid and you will effectively be treated for a couple days with this.  I would continue take some type of NSAID as you have been. I am prescribing Vicodin to take for more severe breakthrough pain.

## 2020-04-17 NOTE — ED Notes (Signed)
Pt ambulatory to CT at this time.

## 2020-04-18 ENCOUNTER — Encounter: Payer: Self-pay | Admitting: Neurology

## 2020-04-18 ENCOUNTER — Other Ambulatory Visit (HOSPITAL_BASED_OUTPATIENT_CLINIC_OR_DEPARTMENT_OTHER): Payer: Self-pay | Admitting: Family Medicine

## 2020-04-18 DIAGNOSIS — R519 Headache, unspecified: Secondary | ICD-10-CM

## 2020-04-19 ENCOUNTER — Ambulatory Visit (HOSPITAL_BASED_OUTPATIENT_CLINIC_OR_DEPARTMENT_OTHER)
Admission: RE | Admit: 2020-04-19 | Discharge: 2020-04-19 | Disposition: A | Discharge status: Home / Self Care | Payer: Medicare PPO | Source: Ambulatory Visit | Attending: Family Medicine | Admitting: Family Medicine

## 2020-04-19 ENCOUNTER — Other Ambulatory Visit: Payer: Self-pay

## 2020-04-19 DIAGNOSIS — R519 Headache, unspecified: Secondary | ICD-10-CM | POA: Diagnosis present

## 2020-05-21 DIAGNOSIS — M961 Postlaminectomy syndrome, not elsewhere classified: Secondary | ICD-10-CM | POA: Diagnosis not present

## 2020-05-21 DIAGNOSIS — M5030 Other cervical disc degeneration, unspecified cervical region: Secondary | ICD-10-CM | POA: Diagnosis not present

## 2020-06-10 DIAGNOSIS — M5030 Other cervical disc degeneration, unspecified cervical region: Secondary | ICD-10-CM | POA: Diagnosis not present

## 2020-06-10 DIAGNOSIS — M961 Postlaminectomy syndrome, not elsewhere classified: Secondary | ICD-10-CM | POA: Diagnosis not present

## 2020-06-24 DIAGNOSIS — M542 Cervicalgia: Secondary | ICD-10-CM | POA: Diagnosis not present

## 2020-06-24 DIAGNOSIS — M5412 Radiculopathy, cervical region: Secondary | ICD-10-CM | POA: Diagnosis not present

## 2020-06-24 DIAGNOSIS — M5030 Other cervical disc degeneration, unspecified cervical region: Secondary | ICD-10-CM | POA: Diagnosis not present

## 2020-06-28 DIAGNOSIS — M47812 Spondylosis without myelopathy or radiculopathy, cervical region: Secondary | ICD-10-CM | POA: Diagnosis not present

## 2020-07-10 ENCOUNTER — Ambulatory Visit: Payer: Medicare PPO | Admitting: Neurology

## 2020-07-14 DIAGNOSIS — M5481 Occipital neuralgia: Secondary | ICD-10-CM | POA: Diagnosis not present

## 2020-07-14 DIAGNOSIS — M961 Postlaminectomy syndrome, not elsewhere classified: Secondary | ICD-10-CM | POA: Diagnosis not present

## 2020-07-14 DIAGNOSIS — M5030 Other cervical disc degeneration, unspecified cervical region: Secondary | ICD-10-CM | POA: Diagnosis not present

## 2020-07-22 DIAGNOSIS — M5481 Occipital neuralgia: Secondary | ICD-10-CM | POA: Diagnosis not present

## 2020-08-06 DIAGNOSIS — M961 Postlaminectomy syndrome, not elsewhere classified: Secondary | ICD-10-CM | POA: Diagnosis not present

## 2020-08-06 DIAGNOSIS — M5481 Occipital neuralgia: Secondary | ICD-10-CM | POA: Diagnosis not present

## 2020-08-06 DIAGNOSIS — M5030 Other cervical disc degeneration, unspecified cervical region: Secondary | ICD-10-CM | POA: Diagnosis not present

## 2020-08-27 DIAGNOSIS — M5030 Other cervical disc degeneration, unspecified cervical region: Secondary | ICD-10-CM | POA: Diagnosis not present

## 2020-08-27 DIAGNOSIS — M961 Postlaminectomy syndrome, not elsewhere classified: Secondary | ICD-10-CM | POA: Diagnosis not present

## 2020-08-27 DIAGNOSIS — M5481 Occipital neuralgia: Secondary | ICD-10-CM | POA: Diagnosis not present

## 2020-08-30 DIAGNOSIS — M5481 Occipital neuralgia: Secondary | ICD-10-CM | POA: Diagnosis not present

## 2020-09-15 DIAGNOSIS — M5481 Occipital neuralgia: Secondary | ICD-10-CM | POA: Diagnosis not present

## 2020-09-16 DIAGNOSIS — M961 Postlaminectomy syndrome, not elsewhere classified: Secondary | ICD-10-CM | POA: Diagnosis not present

## 2020-09-16 DIAGNOSIS — M5481 Occipital neuralgia: Secondary | ICD-10-CM | POA: Diagnosis not present

## 2020-09-17 DIAGNOSIS — M5412 Radiculopathy, cervical region: Secondary | ICD-10-CM | POA: Diagnosis not present

## 2020-09-17 DIAGNOSIS — R030 Elevated blood-pressure reading, without diagnosis of hypertension: Secondary | ICD-10-CM | POA: Diagnosis not present

## 2020-09-17 DIAGNOSIS — G8929 Other chronic pain: Secondary | ICD-10-CM | POA: Diagnosis not present

## 2020-09-17 DIAGNOSIS — Z7722 Contact with and (suspected) exposure to environmental tobacco smoke (acute) (chronic): Secondary | ICD-10-CM | POA: Diagnosis not present

## 2020-09-22 DIAGNOSIS — M5481 Occipital neuralgia: Secondary | ICD-10-CM | POA: Diagnosis not present

## 2020-09-23 DIAGNOSIS — G4486 Cervicogenic headache: Secondary | ICD-10-CM | POA: Diagnosis not present

## 2020-09-23 DIAGNOSIS — M542 Cervicalgia: Secondary | ICD-10-CM | POA: Diagnosis not present

## 2020-09-23 DIAGNOSIS — Z5181 Encounter for therapeutic drug level monitoring: Secondary | ICD-10-CM | POA: Diagnosis not present

## 2020-09-23 DIAGNOSIS — M47812 Spondylosis without myelopathy or radiculopathy, cervical region: Secondary | ICD-10-CM | POA: Diagnosis not present

## 2020-09-23 DIAGNOSIS — Z79899 Other long term (current) drug therapy: Secondary | ICD-10-CM | POA: Diagnosis not present

## 2020-09-24 DIAGNOSIS — M5481 Occipital neuralgia: Secondary | ICD-10-CM | POA: Diagnosis not present

## 2020-09-26 DIAGNOSIS — Z20822 Contact with and (suspected) exposure to covid-19: Secondary | ICD-10-CM | POA: Diagnosis not present

## 2020-10-02 DIAGNOSIS — M5481 Occipital neuralgia: Secondary | ICD-10-CM | POA: Diagnosis not present

## 2020-10-06 DIAGNOSIS — M5481 Occipital neuralgia: Secondary | ICD-10-CM | POA: Diagnosis not present

## 2020-10-08 DIAGNOSIS — M5481 Occipital neuralgia: Secondary | ICD-10-CM | POA: Diagnosis not present

## 2020-10-24 DIAGNOSIS — M47812 Spondylosis without myelopathy or radiculopathy, cervical region: Secondary | ICD-10-CM | POA: Diagnosis not present

## 2020-11-10 DIAGNOSIS — G894 Chronic pain syndrome: Secondary | ICD-10-CM | POA: Diagnosis not present

## 2020-11-10 DIAGNOSIS — M542 Cervicalgia: Secondary | ICD-10-CM | POA: Diagnosis not present

## 2020-11-10 DIAGNOSIS — G4486 Cervicogenic headache: Secondary | ICD-10-CM | POA: Diagnosis not present

## 2020-11-10 DIAGNOSIS — M47812 Spondylosis without myelopathy or radiculopathy, cervical region: Secondary | ICD-10-CM | POA: Diagnosis not present

## 2020-12-11 DIAGNOSIS — M47812 Spondylosis without myelopathy or radiculopathy, cervical region: Secondary | ICD-10-CM | POA: Diagnosis not present

## 2020-12-24 DIAGNOSIS — M47812 Spondylosis without myelopathy or radiculopathy, cervical region: Secondary | ICD-10-CM | POA: Diagnosis not present

## 2020-12-24 DIAGNOSIS — M542 Cervicalgia: Secondary | ICD-10-CM | POA: Diagnosis not present

## 2020-12-31 DIAGNOSIS — M542 Cervicalgia: Secondary | ICD-10-CM | POA: Diagnosis not present

## 2020-12-31 DIAGNOSIS — M47812 Spondylosis without myelopathy or radiculopathy, cervical region: Secondary | ICD-10-CM | POA: Diagnosis not present

## 2021-01-27 DIAGNOSIS — M542 Cervicalgia: Secondary | ICD-10-CM | POA: Diagnosis not present

## 2021-01-27 DIAGNOSIS — G4486 Cervicogenic headache: Secondary | ICD-10-CM | POA: Diagnosis not present

## 2021-01-27 DIAGNOSIS — M47812 Spondylosis without myelopathy or radiculopathy, cervical region: Secondary | ICD-10-CM | POA: Diagnosis not present

## 2021-01-27 DIAGNOSIS — G894 Chronic pain syndrome: Secondary | ICD-10-CM | POA: Diagnosis not present

## 2021-02-01 DIAGNOSIS — G894 Chronic pain syndrome: Secondary | ICD-10-CM | POA: Diagnosis not present

## 2021-02-02 DIAGNOSIS — G894 Chronic pain syndrome: Secondary | ICD-10-CM | POA: Diagnosis not present

## 2021-02-19 DIAGNOSIS — G894 Chronic pain syndrome: Secondary | ICD-10-CM | POA: Diagnosis not present

## 2021-02-19 DIAGNOSIS — M542 Cervicalgia: Secondary | ICD-10-CM | POA: Diagnosis not present

## 2021-03-11 DIAGNOSIS — M542 Cervicalgia: Secondary | ICD-10-CM | POA: Diagnosis not present

## 2021-03-11 DIAGNOSIS — G894 Chronic pain syndrome: Secondary | ICD-10-CM | POA: Diagnosis not present

## 2021-03-11 DIAGNOSIS — M5412 Radiculopathy, cervical region: Secondary | ICD-10-CM | POA: Diagnosis not present

## 2021-03-18 IMAGING — MR MR CERVICAL SPINE W/O CM
5 series · 31 of 48 positions shown · non-contrast
Comparison: CT scan 04/17/2020

CLINICAL DATA: Right head and neck pain for 9 weeks but worsening
over the last week.

EXAM:
MRI CERVICAL SPINE WITHOUT CONTRAST
TECHNIQUE: Multiplanar, multisequence MR imaging of the cervical spine was
performed. No intravenous contrast was administered.

[Series 2: (id) tse sag · sagittal · 3.0mm · 0.41mm/px · 6 of 15 slices shown (1 of 2)]
[im 1/15]
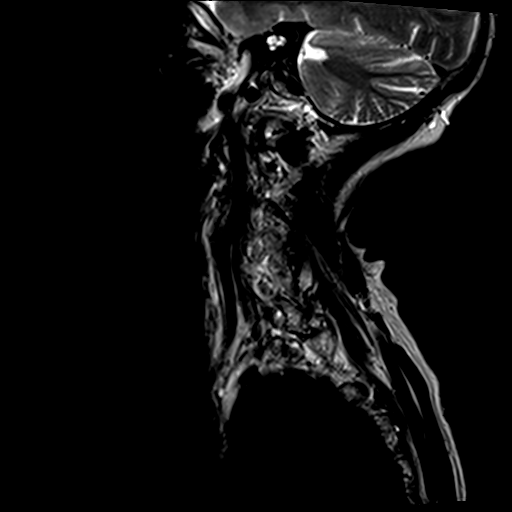
[im 3/15]
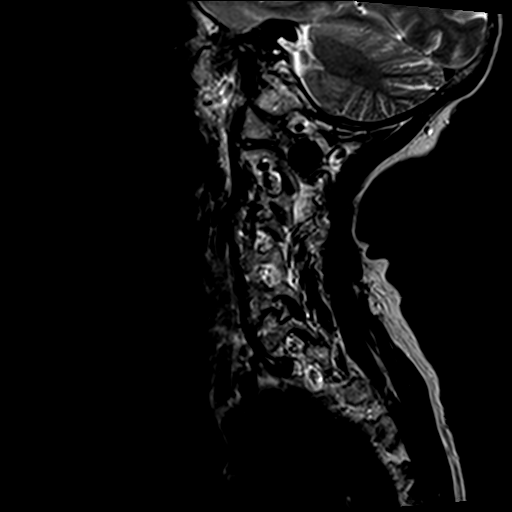
[im 6/15]
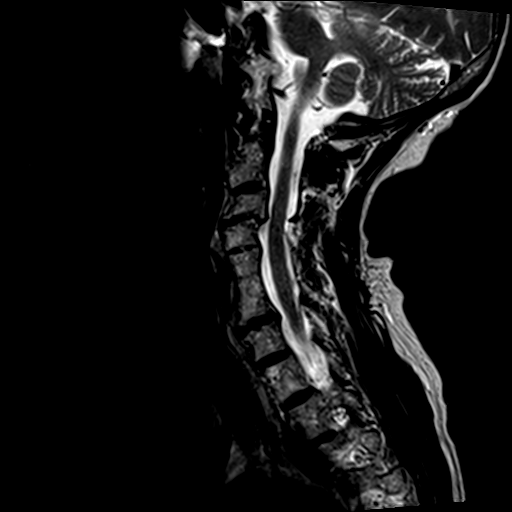
[im 9/15]
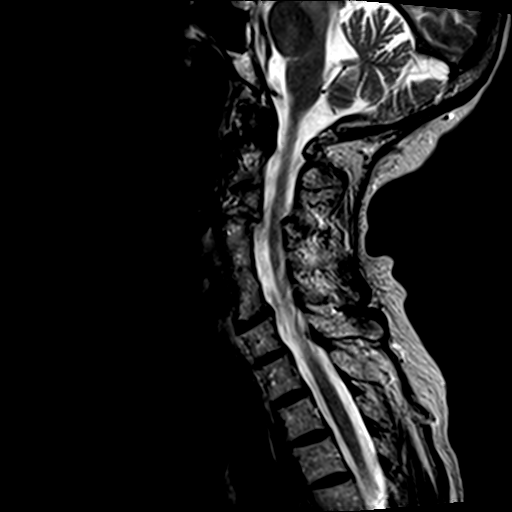
[im 12/15]
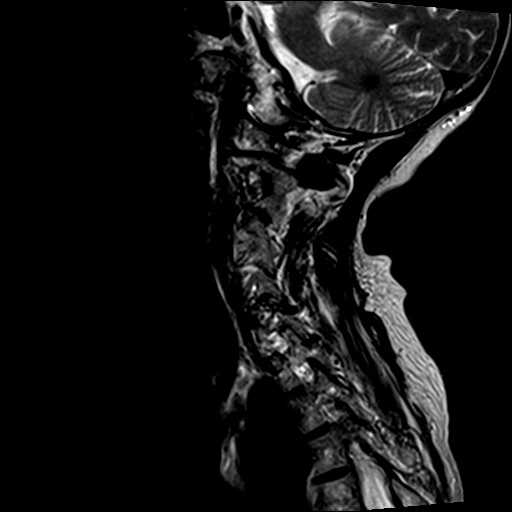
[im 15/15]
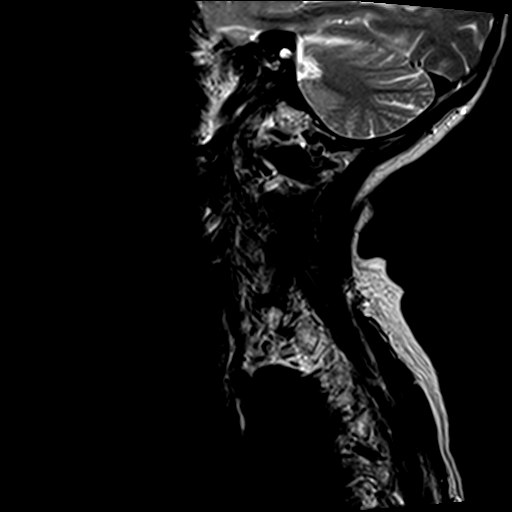

[Series 3: (id) tse sag · sagittal · 3.0mm · 0.41mm/px · 2 of 15 slices shown (2 of 2)]
[im 1/15]
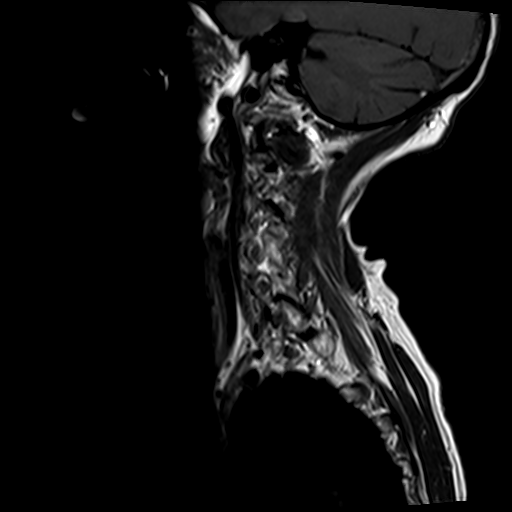
[im 3/15]
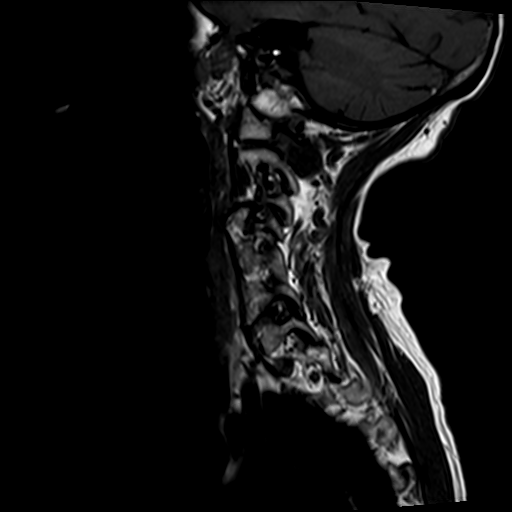

[Series 4: STIR · sagittal · 3.0mm · 0.82mm/px · 7 of 15 slices shown]
[im 1/15]
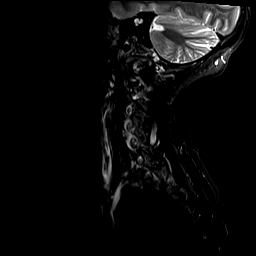
[im 3/15]
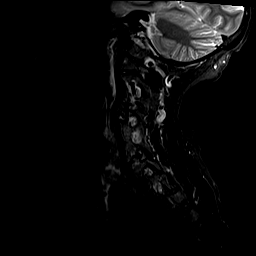
[im 5/15]
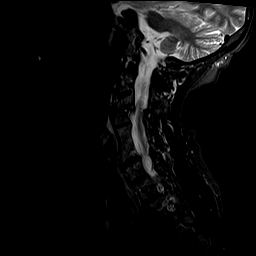
[im 8/15]
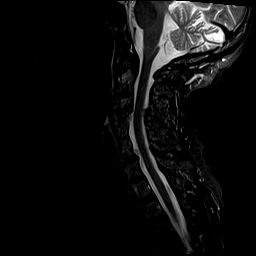
[im 10/15]
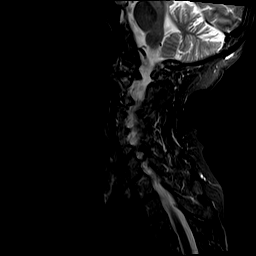
[im 12/15]
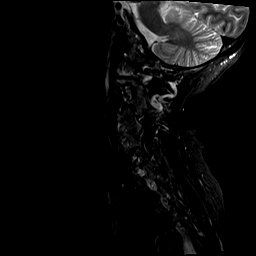
[im 15/15]
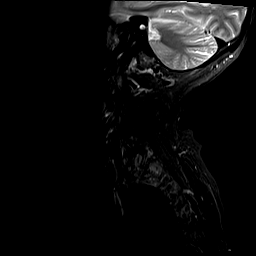

[Series 5: T2 · axial · 3.0mm · 0.39mm/px · z∈[-208,-99]mm · 8 of 32 slices shown (1 of 2)]
[im 1/32]
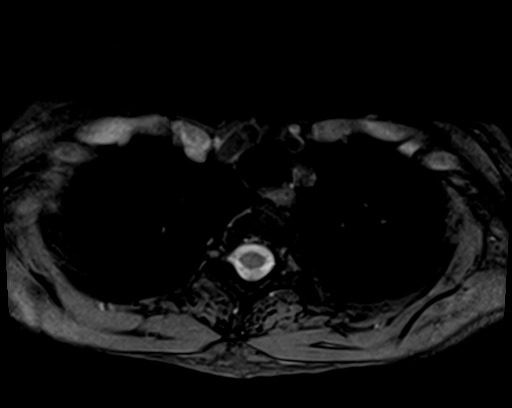
[im 5/32]
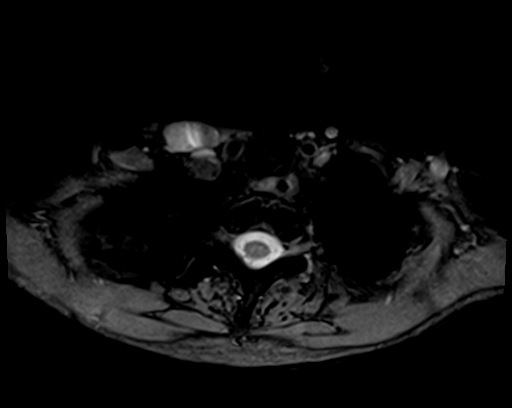
[im 10/32]
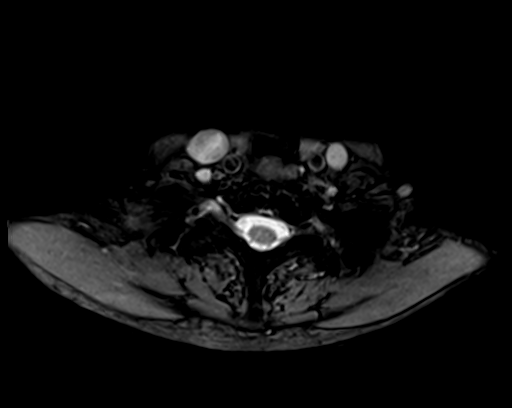
[im 15/32]
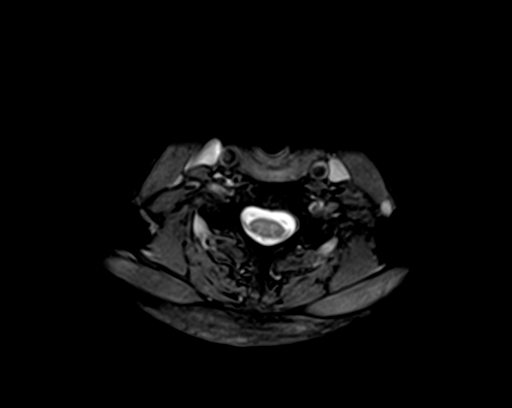
[im 17/32]
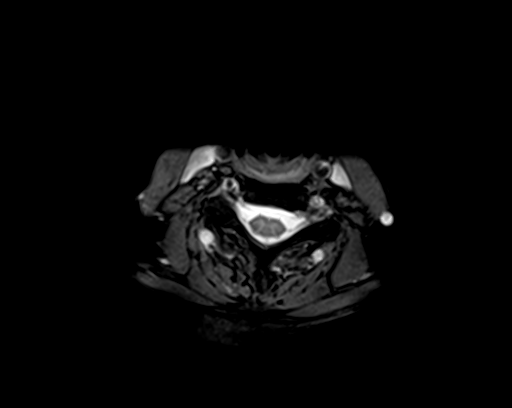
[im 22/32]
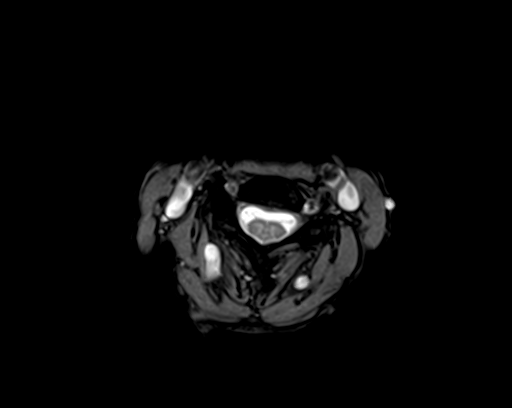
[im 27/32]
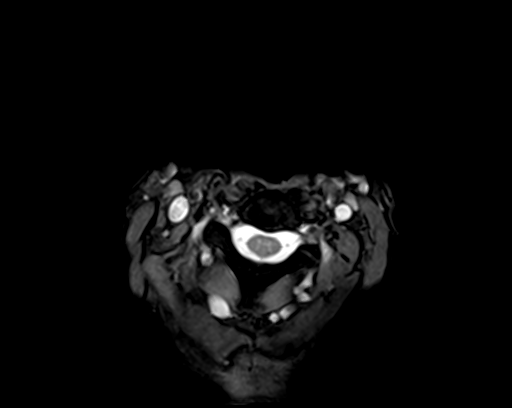
[im 32/32]
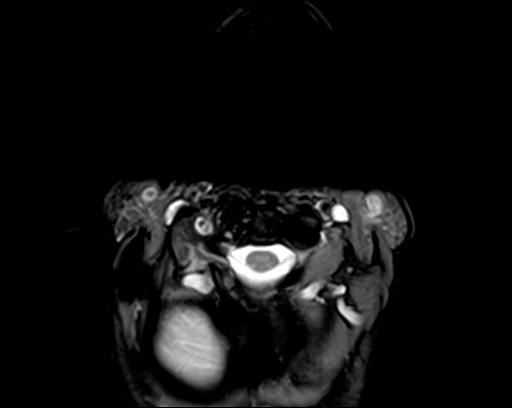

[Series 6: T2 · axial · 3.0mm · 0.78mm/px · z∈[-213,-104]mm · 8 of 32 slices shown (2 of 2)]
[im 1/32]
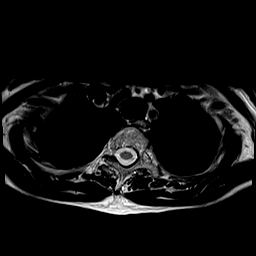
[im 5/32]
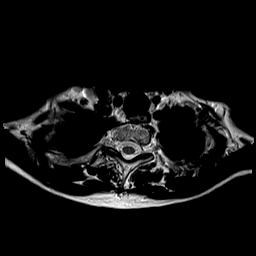
[im 10/32]
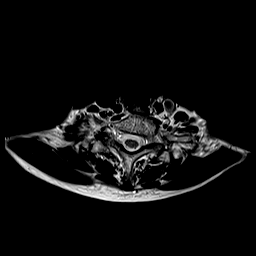
[im 15/32]
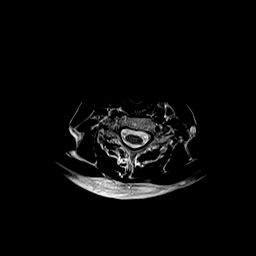
[im 17/32]
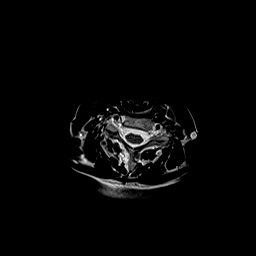
[im 22/32]
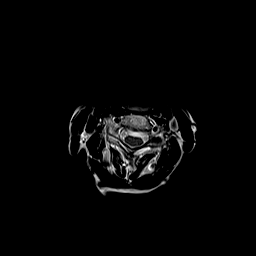
[im 27/32]
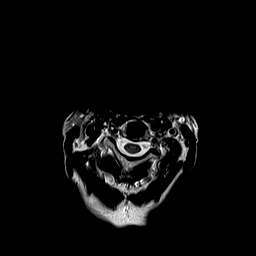
[im 32/32]
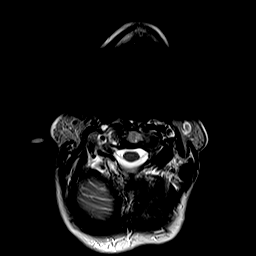

[31 of 48 positions shown; findings below may reference images not displayed]

FINDINGS: Alignment: 3 mm of degenerative retrolisthesis at C3-4.

Vertebrae: Narrowed AP vertebral diameter at C4-C5-C6 with solid
interbody fusion. Facet fusion on the right at C2-3 and at C3-4 and
C4-5, and on the left at C3-4 and C4-5. There is spurring and
subcortical marrow edema along the right side of the C1-2
articulation anteriorly with the somewhat striking osteophyte. No
other significant vertebral edema. There is disc desiccation at the
unfused vertebral levels with loss of disc height especially at
C3-4.

Cord: No significant abnormal spinal cord signal is observed.

Posterior Fossa, vertebral arteries, paraspinal tissues:
Unremarkable

Disc levels:

C2-3: Unremarkable.

C3-4: Mild bilateral foraminal stenosis due to the subluxation and
uncinate spurring.

C4-5: No impingement. Fused level.

C5-6: No impingement. Right facet spurring. Fused level.

C6-7: Mild bilateral foraminal stenosis due to disc bulge and
uncinate spurring.

C7-T1: No impingement. Mild bilateral facet arthropathy.
IMPRESSION: 1. Cervical spondylosis and degenerative disc disease, causing mild
impingement at C3-4 and C6-7.
2. Anterolateral spurring and subcortical marrow edema along the
right side of the C1-2 articulation with somewhat striking
osteophyte.
3. Multilevel vertebral fusion as noted above.

## 2021-04-08 DIAGNOSIS — M810 Age-related osteoporosis without current pathological fracture: Secondary | ICD-10-CM | POA: Diagnosis not present

## 2021-04-08 DIAGNOSIS — Z1389 Encounter for screening for other disorder: Secondary | ICD-10-CM | POA: Diagnosis not present

## 2021-04-08 DIAGNOSIS — Z0000 Encounter for general adult medical examination without abnormal findings: Secondary | ICD-10-CM | POA: Diagnosis not present

## 2021-04-08 DIAGNOSIS — Z131 Encounter for screening for diabetes mellitus: Secondary | ICD-10-CM | POA: Diagnosis not present

## 2021-04-10 ENCOUNTER — Other Ambulatory Visit: Payer: Self-pay | Admitting: Family Medicine

## 2021-04-10 DIAGNOSIS — Z1231 Encounter for screening mammogram for malignant neoplasm of breast: Secondary | ICD-10-CM

## 2021-04-10 DIAGNOSIS — M810 Age-related osteoporosis without current pathological fracture: Secondary | ICD-10-CM

## 2021-04-16 DIAGNOSIS — G894 Chronic pain syndrome: Secondary | ICD-10-CM | POA: Diagnosis not present

## 2021-04-16 DIAGNOSIS — G4486 Cervicogenic headache: Secondary | ICD-10-CM | POA: Diagnosis not present

## 2021-04-16 DIAGNOSIS — Z9689 Presence of other specified functional implants: Secondary | ICD-10-CM | POA: Diagnosis not present

## 2021-05-08 DIAGNOSIS — M810 Age-related osteoporosis without current pathological fracture: Secondary | ICD-10-CM | POA: Diagnosis not present

## 2021-05-08 DIAGNOSIS — M5481 Occipital neuralgia: Secondary | ICD-10-CM | POA: Diagnosis not present

## 2021-05-12 DIAGNOSIS — G4486 Cervicogenic headache: Secondary | ICD-10-CM | POA: Diagnosis not present

## 2021-05-12 DIAGNOSIS — M5481 Occipital neuralgia: Secondary | ICD-10-CM | POA: Diagnosis not present

## 2021-06-09 DIAGNOSIS — M5481 Occipital neuralgia: Secondary | ICD-10-CM | POA: Diagnosis not present

## 2021-07-13 DIAGNOSIS — Z91048 Other nonmedicinal substance allergy status: Secondary | ICD-10-CM | POA: Diagnosis not present

## 2021-07-13 DIAGNOSIS — M5481 Occipital neuralgia: Secondary | ICD-10-CM | POA: Diagnosis not present

## 2021-07-13 DIAGNOSIS — R519 Headache, unspecified: Secondary | ICD-10-CM | POA: Diagnosis not present

## 2021-07-13 DIAGNOSIS — Z981 Arthrodesis status: Secondary | ICD-10-CM | POA: Diagnosis not present

## 2021-07-24 DIAGNOSIS — M792 Neuralgia and neuritis, unspecified: Secondary | ICD-10-CM | POA: Diagnosis not present

## 2021-08-10 DIAGNOSIS — M7918 Myalgia, other site: Secondary | ICD-10-CM | POA: Diagnosis not present

## 2021-08-10 DIAGNOSIS — G4486 Cervicogenic headache: Secondary | ICD-10-CM | POA: Diagnosis not present

## 2021-08-10 DIAGNOSIS — M5481 Occipital neuralgia: Secondary | ICD-10-CM | POA: Diagnosis not present

## 2021-09-18 DIAGNOSIS — R051 Acute cough: Secondary | ICD-10-CM | POA: Diagnosis not present

## 2021-09-18 DIAGNOSIS — U071 COVID-19: Secondary | ICD-10-CM | POA: Diagnosis not present

## 2021-09-24 ENCOUNTER — Ambulatory Visit: Payer: Medicare PPO

## 2021-09-24 ENCOUNTER — Other Ambulatory Visit: Payer: Medicare PPO

## 2021-10-23 ENCOUNTER — Other Ambulatory Visit: Payer: Self-pay

## 2021-10-23 ENCOUNTER — Ambulatory Visit
Admission: RE | Admit: 2021-10-23 | Discharge: 2021-10-23 | Disposition: A | Discharge status: Home / Self Care | Payer: Medicare PPO | Source: Ambulatory Visit | Attending: Family Medicine | Admitting: Family Medicine

## 2021-10-23 DIAGNOSIS — Z1231 Encounter for screening mammogram for malignant neoplasm of breast: Secondary | ICD-10-CM | POA: Diagnosis not present

## 2021-11-02 DIAGNOSIS — Z9689 Presence of other specified functional implants: Secondary | ICD-10-CM | POA: Diagnosis not present

## 2021-11-02 DIAGNOSIS — Z5181 Encounter for therapeutic drug level monitoring: Secondary | ICD-10-CM | POA: Diagnosis not present

## 2021-11-02 DIAGNOSIS — M542 Cervicalgia: Secondary | ICD-10-CM | POA: Diagnosis not present

## 2021-11-02 DIAGNOSIS — Z79899 Other long term (current) drug therapy: Secondary | ICD-10-CM | POA: Diagnosis not present

## 2021-11-02 DIAGNOSIS — G4486 Cervicogenic headache: Secondary | ICD-10-CM | POA: Diagnosis not present

## 2021-11-02 DIAGNOSIS — M7918 Myalgia, other site: Secondary | ICD-10-CM | POA: Diagnosis not present

## 2021-12-18 ENCOUNTER — Ambulatory Visit
Admission: RE | Admit: 2021-12-18 | Discharge: 2021-12-18 | Disposition: A | Discharge status: Home / Self Care | Payer: Medicare PPO | Source: Ambulatory Visit | Attending: Family Medicine | Admitting: Family Medicine

## 2021-12-18 DIAGNOSIS — M810 Age-related osteoporosis without current pathological fracture: Secondary | ICD-10-CM | POA: Diagnosis not present

## 2021-12-18 DIAGNOSIS — Z780 Asymptomatic menopausal state: Secondary | ICD-10-CM | POA: Diagnosis not present

## 2021-12-31 ENCOUNTER — Other Ambulatory Visit: Payer: Self-pay | Admitting: Family Medicine

## 2021-12-31 DIAGNOSIS — M810 Age-related osteoporosis without current pathological fracture: Secondary | ICD-10-CM

## 2021-12-31 DIAGNOSIS — Z1231 Encounter for screening mammogram for malignant neoplasm of breast: Secondary | ICD-10-CM

## 2022-01-01 DIAGNOSIS — M810 Age-related osteoporosis without current pathological fracture: Secondary | ICD-10-CM | POA: Diagnosis not present

## 2022-01-01 DIAGNOSIS — L570 Actinic keratosis: Secondary | ICD-10-CM | POA: Diagnosis not present

## 2022-01-25 DIAGNOSIS — M7918 Myalgia, other site: Secondary | ICD-10-CM | POA: Diagnosis not present

## 2022-01-25 DIAGNOSIS — G4486 Cervicogenic headache: Secondary | ICD-10-CM | POA: Diagnosis not present

## 2022-01-25 DIAGNOSIS — M47812 Spondylosis without myelopathy or radiculopathy, cervical region: Secondary | ICD-10-CM | POA: Diagnosis not present

## 2022-01-25 DIAGNOSIS — M542 Cervicalgia: Secondary | ICD-10-CM | POA: Diagnosis not present

## 2022-02-10 DIAGNOSIS — M47812 Spondylosis without myelopathy or radiculopathy, cervical region: Secondary | ICD-10-CM | POA: Diagnosis not present

## 2022-03-09 DIAGNOSIS — M7918 Myalgia, other site: Secondary | ICD-10-CM | POA: Diagnosis not present

## 2022-03-09 DIAGNOSIS — Z9689 Presence of other specified functional implants: Secondary | ICD-10-CM | POA: Diagnosis not present

## 2022-03-09 DIAGNOSIS — M47812 Spondylosis without myelopathy or radiculopathy, cervical region: Secondary | ICD-10-CM | POA: Diagnosis not present

## 2022-03-09 DIAGNOSIS — M542 Cervicalgia: Secondary | ICD-10-CM | POA: Diagnosis not present

## 2022-03-09 DIAGNOSIS — G4486 Cervicogenic headache: Secondary | ICD-10-CM | POA: Diagnosis not present

## 2022-04-30 DIAGNOSIS — M810 Age-related osteoporosis without current pathological fracture: Secondary | ICD-10-CM | POA: Diagnosis not present

## 2022-05-11 DIAGNOSIS — Z01419 Encounter for gynecological examination (general) (routine) without abnormal findings: Secondary | ICD-10-CM | POA: Diagnosis not present

## 2022-05-11 DIAGNOSIS — Z0000 Encounter for general adult medical examination without abnormal findings: Secondary | ICD-10-CM | POA: Diagnosis not present

## 2022-05-11 DIAGNOSIS — G44209 Tension-type headache, unspecified, not intractable: Secondary | ICD-10-CM | POA: Diagnosis not present

## 2022-05-11 DIAGNOSIS — Z1331 Encounter for screening for depression: Secondary | ICD-10-CM | POA: Diagnosis not present

## 2022-05-11 DIAGNOSIS — M810 Age-related osteoporosis without current pathological fracture: Secondary | ICD-10-CM | POA: Diagnosis not present

## 2022-07-06 DIAGNOSIS — M47812 Spondylosis without myelopathy or radiculopathy, cervical region: Secondary | ICD-10-CM | POA: Diagnosis not present

## 2022-07-06 DIAGNOSIS — G4486 Cervicogenic headache: Secondary | ICD-10-CM | POA: Diagnosis not present

## 2022-07-06 DIAGNOSIS — M7918 Myalgia, other site: Secondary | ICD-10-CM | POA: Diagnosis not present

## 2022-07-06 DIAGNOSIS — Z9689 Presence of other specified functional implants: Secondary | ICD-10-CM | POA: Diagnosis not present

## 2022-07-06 DIAGNOSIS — M542 Cervicalgia: Secondary | ICD-10-CM | POA: Diagnosis not present

## 2022-08-18 DIAGNOSIS — M47812 Spondylosis without myelopathy or radiculopathy, cervical region: Secondary | ICD-10-CM | POA: Diagnosis not present

## 2022-09-20 DIAGNOSIS — Z5181 Encounter for therapeutic drug level monitoring: Secondary | ICD-10-CM | POA: Diagnosis not present

## 2022-09-20 DIAGNOSIS — G4486 Cervicogenic headache: Secondary | ICD-10-CM | POA: Diagnosis not present

## 2022-09-20 DIAGNOSIS — M47812 Spondylosis without myelopathy or radiculopathy, cervical region: Secondary | ICD-10-CM | POA: Diagnosis not present

## 2022-09-20 DIAGNOSIS — M7918 Myalgia, other site: Secondary | ICD-10-CM | POA: Diagnosis not present

## 2022-09-20 DIAGNOSIS — M542 Cervicalgia: Secondary | ICD-10-CM | POA: Diagnosis not present

## 2022-09-20 DIAGNOSIS — Z79899 Other long term (current) drug therapy: Secondary | ICD-10-CM | POA: Diagnosis not present

## 2022-10-13 DIAGNOSIS — J019 Acute sinusitis, unspecified: Secondary | ICD-10-CM | POA: Diagnosis not present

## 2022-10-13 DIAGNOSIS — J40 Bronchitis, not specified as acute or chronic: Secondary | ICD-10-CM | POA: Diagnosis not present

## 2022-10-25 ENCOUNTER — Ambulatory Visit
Admission: RE | Admit: 2022-10-25 | Discharge: 2022-10-25 | Disposition: A | Discharge status: Home / Self Care | Payer: Medicare PPO | Source: Ambulatory Visit | Attending: Family Medicine | Admitting: Family Medicine

## 2022-10-25 DIAGNOSIS — Z1231 Encounter for screening mammogram for malignant neoplasm of breast: Secondary | ICD-10-CM

## 2022-11-09 DIAGNOSIS — J189 Pneumonia, unspecified organism: Secondary | ICD-10-CM | POA: Diagnosis not present

## 2022-11-09 DIAGNOSIS — R053 Chronic cough: Secondary | ICD-10-CM | POA: Diagnosis not present

## 2022-11-09 DIAGNOSIS — Z6823 Body mass index (BMI) 23.0-23.9, adult: Secondary | ICD-10-CM | POA: Diagnosis not present

## 2022-11-30 DIAGNOSIS — R053 Chronic cough: Secondary | ICD-10-CM | POA: Diagnosis not present

## 2022-11-30 DIAGNOSIS — Z6823 Body mass index (BMI) 23.0-23.9, adult: Secondary | ICD-10-CM | POA: Diagnosis not present

## 2022-12-22 DIAGNOSIS — J189 Pneumonia, unspecified organism: Secondary | ICD-10-CM | POA: Diagnosis not present

## 2023-01-17 DIAGNOSIS — M47812 Spondylosis without myelopathy or radiculopathy, cervical region: Secondary | ICD-10-CM | POA: Diagnosis not present

## 2023-01-17 DIAGNOSIS — Z9689 Presence of other specified functional implants: Secondary | ICD-10-CM | POA: Diagnosis not present

## 2023-01-17 DIAGNOSIS — M542 Cervicalgia: Secondary | ICD-10-CM | POA: Diagnosis not present

## 2023-01-17 DIAGNOSIS — M7918 Myalgia, other site: Secondary | ICD-10-CM | POA: Diagnosis not present

## 2023-01-17 DIAGNOSIS — G4486 Cervicogenic headache: Secondary | ICD-10-CM | POA: Diagnosis not present

## 2023-02-16 DIAGNOSIS — M47812 Spondylosis without myelopathy or radiculopathy, cervical region: Secondary | ICD-10-CM | POA: Diagnosis not present

## 2023-03-16 DIAGNOSIS — M7918 Myalgia, other site: Secondary | ICD-10-CM | POA: Diagnosis not present

## 2023-03-16 DIAGNOSIS — M47812 Spondylosis without myelopathy or radiculopathy, cervical region: Secondary | ICD-10-CM | POA: Diagnosis not present

## 2023-03-16 DIAGNOSIS — G4486 Cervicogenic headache: Secondary | ICD-10-CM | POA: Diagnosis not present

## 2023-03-16 DIAGNOSIS — M542 Cervicalgia: Secondary | ICD-10-CM | POA: Diagnosis not present

## 2023-03-16 DIAGNOSIS — Z9689 Presence of other specified functional implants: Secondary | ICD-10-CM | POA: Diagnosis not present

## 2023-05-11 DIAGNOSIS — M810 Age-related osteoporosis without current pathological fracture: Secondary | ICD-10-CM | POA: Diagnosis not present

## 2023-05-16 DIAGNOSIS — R030 Elevated blood-pressure reading, without diagnosis of hypertension: Secondary | ICD-10-CM | POA: Diagnosis not present

## 2023-05-16 DIAGNOSIS — G44209 Tension-type headache, unspecified, not intractable: Secondary | ICD-10-CM | POA: Diagnosis not present

## 2023-05-16 DIAGNOSIS — M810 Age-related osteoporosis without current pathological fracture: Secondary | ICD-10-CM | POA: Diagnosis not present

## 2023-05-16 DIAGNOSIS — Z6824 Body mass index (BMI) 24.0-24.9, adult: Secondary | ICD-10-CM | POA: Diagnosis not present

## 2023-05-16 DIAGNOSIS — Z1331 Encounter for screening for depression: Secondary | ICD-10-CM | POA: Diagnosis not present

## 2023-05-16 DIAGNOSIS — Z23 Encounter for immunization: Secondary | ICD-10-CM | POA: Diagnosis not present

## 2023-05-16 DIAGNOSIS — Z0000 Encounter for general adult medical examination without abnormal findings: Secondary | ICD-10-CM | POA: Diagnosis not present

## 2023-07-18 DIAGNOSIS — M7918 Myalgia, other site: Secondary | ICD-10-CM | POA: Diagnosis not present

## 2023-07-18 DIAGNOSIS — M47812 Spondylosis without myelopathy or radiculopathy, cervical region: Secondary | ICD-10-CM | POA: Diagnosis not present

## 2023-07-18 DIAGNOSIS — G4486 Cervicogenic headache: Secondary | ICD-10-CM | POA: Diagnosis not present

## 2023-07-18 DIAGNOSIS — M542 Cervicalgia: Secondary | ICD-10-CM | POA: Diagnosis not present

## 2023-07-18 DIAGNOSIS — Z9689 Presence of other specified functional implants: Secondary | ICD-10-CM | POA: Diagnosis not present

## 2023-09-07 DIAGNOSIS — M47812 Spondylosis without myelopathy or radiculopathy, cervical region: Secondary | ICD-10-CM | POA: Diagnosis not present

## 2023-09-13 ENCOUNTER — Other Ambulatory Visit: Payer: Self-pay | Admitting: Family Medicine

## 2023-09-13 DIAGNOSIS — Z1231 Encounter for screening mammogram for malignant neoplasm of breast: Secondary | ICD-10-CM

## 2023-09-22 DIAGNOSIS — G4486 Cervicogenic headache: Secondary | ICD-10-CM | POA: Diagnosis not present

## 2023-09-22 DIAGNOSIS — M7918 Myalgia, other site: Secondary | ICD-10-CM | POA: Diagnosis not present

## 2023-09-22 DIAGNOSIS — M47812 Spondylosis without myelopathy or radiculopathy, cervical region: Secondary | ICD-10-CM | POA: Diagnosis not present

## 2023-09-22 DIAGNOSIS — M542 Cervicalgia: Secondary | ICD-10-CM | POA: Diagnosis not present

## 2023-09-22 DIAGNOSIS — G894 Chronic pain syndrome: Secondary | ICD-10-CM | POA: Diagnosis not present

## 2023-10-24 DIAGNOSIS — M542 Cervicalgia: Secondary | ICD-10-CM | POA: Diagnosis not present

## 2023-10-24 DIAGNOSIS — G4486 Cervicogenic headache: Secondary | ICD-10-CM | POA: Diagnosis not present

## 2023-10-28 ENCOUNTER — Ambulatory Visit
Admission: RE | Admit: 2023-10-28 | Discharge: 2023-10-28 | Disposition: A | Discharge status: Home / Self Care | Payer: Medicare PPO | Source: Ambulatory Visit | Attending: Family Medicine | Admitting: Family Medicine

## 2023-10-28 DIAGNOSIS — Z1231 Encounter for screening mammogram for malignant neoplasm of breast: Secondary | ICD-10-CM | POA: Diagnosis not present

## 2023-11-23 DIAGNOSIS — Z9689 Presence of other specified functional implants: Secondary | ICD-10-CM | POA: Diagnosis not present

## 2023-11-23 DIAGNOSIS — Z79899 Other long term (current) drug therapy: Secondary | ICD-10-CM | POA: Diagnosis not present

## 2023-11-23 DIAGNOSIS — G4486 Cervicogenic headache: Secondary | ICD-10-CM | POA: Diagnosis not present

## 2023-11-23 DIAGNOSIS — G894 Chronic pain syndrome: Secondary | ICD-10-CM | POA: Diagnosis not present

## 2023-11-23 DIAGNOSIS — Z5181 Encounter for therapeutic drug level monitoring: Secondary | ICD-10-CM | POA: Diagnosis not present

## 2023-11-23 DIAGNOSIS — M5030 Other cervical disc degeneration, unspecified cervical region: Secondary | ICD-10-CM | POA: Diagnosis not present

## 2024-02-15 DIAGNOSIS — M542 Cervicalgia: Secondary | ICD-10-CM | POA: Diagnosis not present

## 2024-02-15 DIAGNOSIS — G894 Chronic pain syndrome: Secondary | ICD-10-CM | POA: Diagnosis not present

## 2024-02-15 DIAGNOSIS — M47812 Spondylosis without myelopathy or radiculopathy, cervical region: Secondary | ICD-10-CM | POA: Diagnosis not present

## 2024-03-09 DIAGNOSIS — J209 Acute bronchitis, unspecified: Secondary | ICD-10-CM | POA: Diagnosis not present

## 2024-03-09 DIAGNOSIS — Z6824 Body mass index (BMI) 24.0-24.9, adult: Secondary | ICD-10-CM | POA: Diagnosis not present

## 2024-04-27 DIAGNOSIS — G894 Chronic pain syndrome: Secondary | ICD-10-CM | POA: Diagnosis not present

## 2024-05-08 DIAGNOSIS — M47812 Spondylosis without myelopathy or radiculopathy, cervical region: Secondary | ICD-10-CM | POA: Diagnosis not present

## 2024-05-08 DIAGNOSIS — M542 Cervicalgia: Secondary | ICD-10-CM | POA: Diagnosis not present

## 2024-05-18 DIAGNOSIS — M810 Age-related osteoporosis without current pathological fracture: Secondary | ICD-10-CM | POA: Diagnosis not present

## 2024-05-21 DIAGNOSIS — M47812 Spondylosis without myelopathy or radiculopathy, cervical region: Secondary | ICD-10-CM | POA: Diagnosis not present

## 2024-05-24 DIAGNOSIS — Z1211 Encounter for screening for malignant neoplasm of colon: Secondary | ICD-10-CM | POA: Diagnosis not present

## 2024-05-24 DIAGNOSIS — M810 Age-related osteoporosis without current pathological fracture: Secondary | ICD-10-CM | POA: Diagnosis not present

## 2024-05-24 DIAGNOSIS — Z13220 Encounter for screening for lipoid disorders: Secondary | ICD-10-CM | POA: Diagnosis not present

## 2024-05-24 DIAGNOSIS — Z0000 Encounter for general adult medical examination without abnormal findings: Secondary | ICD-10-CM | POA: Diagnosis not present

## 2024-05-24 DIAGNOSIS — Z6824 Body mass index (BMI) 24.0-24.9, adult: Secondary | ICD-10-CM | POA: Diagnosis not present

## 2024-05-24 DIAGNOSIS — G44209 Tension-type headache, unspecified, not intractable: Secondary | ICD-10-CM | POA: Diagnosis not present

## 2024-05-24 DIAGNOSIS — Z23 Encounter for immunization: Secondary | ICD-10-CM | POA: Diagnosis not present

## 2024-05-25 ENCOUNTER — Other Ambulatory Visit (HOSPITAL_BASED_OUTPATIENT_CLINIC_OR_DEPARTMENT_OTHER): Payer: Self-pay | Admitting: Family Medicine

## 2024-05-25 DIAGNOSIS — M810 Age-related osteoporosis without current pathological fracture: Secondary | ICD-10-CM

## 2024-05-28 DIAGNOSIS — M542 Cervicalgia: Secondary | ICD-10-CM | POA: Diagnosis not present

## 2024-05-28 DIAGNOSIS — G4486 Cervicogenic headache: Secondary | ICD-10-CM | POA: Diagnosis not present

## 2024-05-28 DIAGNOSIS — G894 Chronic pain syndrome: Secondary | ICD-10-CM | POA: Diagnosis not present

## 2024-05-28 DIAGNOSIS — M47812 Spondylosis without myelopathy or radiculopathy, cervical region: Secondary | ICD-10-CM | POA: Diagnosis not present

## 2024-06-08 DIAGNOSIS — M7918 Myalgia, other site: Secondary | ICD-10-CM | POA: Diagnosis not present

## 2024-07-05 DIAGNOSIS — Z1211 Encounter for screening for malignant neoplasm of colon: Secondary | ICD-10-CM | POA: Diagnosis not present

## 2024-07-06 DIAGNOSIS — Z9689 Presence of other specified functional implants: Secondary | ICD-10-CM | POA: Diagnosis not present

## 2024-07-06 DIAGNOSIS — G894 Chronic pain syndrome: Secondary | ICD-10-CM | POA: Diagnosis not present

## 2024-07-06 DIAGNOSIS — M47812 Spondylosis without myelopathy or radiculopathy, cervical region: Secondary | ICD-10-CM | POA: Diagnosis not present

## 2024-07-06 DIAGNOSIS — Z5181 Encounter for therapeutic drug level monitoring: Secondary | ICD-10-CM | POA: Diagnosis not present

## 2024-07-06 DIAGNOSIS — Z79899 Other long term (current) drug therapy: Secondary | ICD-10-CM | POA: Diagnosis not present

## 2024-07-06 DIAGNOSIS — M5030 Other cervical disc degeneration, unspecified cervical region: Secondary | ICD-10-CM | POA: Diagnosis not present

## 2024-07-17 DIAGNOSIS — Z1211 Encounter for screening for malignant neoplasm of colon: Secondary | ICD-10-CM | POA: Diagnosis not present

## 2024-08-17 DIAGNOSIS — M5030 Other cervical disc degeneration, unspecified cervical region: Secondary | ICD-10-CM | POA: Diagnosis not present

## 2024-08-17 DIAGNOSIS — R52 Pain, unspecified: Secondary | ICD-10-CM | POA: Diagnosis not present

## 2024-08-17 DIAGNOSIS — Z9689 Presence of other specified functional implants: Secondary | ICD-10-CM | POA: Diagnosis not present

## 2024-08-17 DIAGNOSIS — M7918 Myalgia, other site: Secondary | ICD-10-CM | POA: Diagnosis not present

## 2024-09-14 ENCOUNTER — Other Ambulatory Visit: Payer: Self-pay | Admitting: Family Medicine

## 2024-09-14 DIAGNOSIS — Z1231 Encounter for screening mammogram for malignant neoplasm of breast: Secondary | ICD-10-CM

## 2024-10-15 DIAGNOSIS — M5030 Other cervical disc degeneration, unspecified cervical region: Secondary | ICD-10-CM | POA: Diagnosis not present

## 2024-10-15 DIAGNOSIS — Z9689 Presence of other specified functional implants: Secondary | ICD-10-CM | POA: Diagnosis not present

## 2024-10-15 DIAGNOSIS — G4486 Cervicogenic headache: Secondary | ICD-10-CM | POA: Diagnosis not present

## 2024-10-29 ENCOUNTER — Ambulatory Visit
Admission: RE | Admit: 2024-10-29 | Discharge: 2024-10-29 | Disposition: A | Source: Ambulatory Visit | Attending: Family Medicine | Admitting: Family Medicine

## 2024-10-29 DIAGNOSIS — Z1231 Encounter for screening mammogram for malignant neoplasm of breast: Secondary | ICD-10-CM | POA: Diagnosis not present

## 2025-01-23 ENCOUNTER — Other Ambulatory Visit (HOSPITAL_BASED_OUTPATIENT_CLINIC_OR_DEPARTMENT_OTHER)
